# Patient Record
Sex: Male | Born: 1955 | Race: White | Marital: Married | Smoking: Former smoker
Health system: Southern US, Community
[De-identification: ages and names within clinical notes are randomized; demographics above are authoritative.]

## PROBLEM LIST (undated history)

## (undated) ENCOUNTER — Emergency Department (HOSPITAL_COMMUNITY): Admission: EM | Source: Home / Self Care

## (undated) DIAGNOSIS — N529 Male erectile dysfunction, unspecified: Secondary | ICD-10-CM

## (undated) DIAGNOSIS — I10 Essential (primary) hypertension: Secondary | ICD-10-CM

## (undated) DIAGNOSIS — E785 Hyperlipidemia, unspecified: Secondary | ICD-10-CM

## (undated) HISTORY — DX: Male erectile dysfunction, unspecified: N52.9

## (undated) HISTORY — DX: Essential (primary) hypertension: I10

## (undated) HISTORY — PX: SKIN SURGERY: SHX2413

## (undated) HISTORY — DX: Hyperlipidemia, unspecified: E78.5

---

## 2014-02-15 ENCOUNTER — Other Ambulatory Visit: Payer: Self-pay | Admitting: Family Medicine

## 2014-02-15 ENCOUNTER — Ambulatory Visit
Admission: RE | Admit: 2014-02-15 | Discharge: 2014-02-15 | Disposition: A | Discharge status: Home / Self Care | Payer: BC Managed Care – PPO | Source: Ambulatory Visit | Attending: Family Medicine | Admitting: Family Medicine

## 2014-02-15 DIAGNOSIS — R609 Edema, unspecified: Secondary | ICD-10-CM

## 2014-02-15 DIAGNOSIS — M25562 Pain in left knee: Secondary | ICD-10-CM

## 2014-03-17 ENCOUNTER — Ambulatory Visit (INDEPENDENT_AMBULATORY_CARE_PROVIDER_SITE_OTHER): Payer: Self-pay | Admitting: Surgery

## 2014-03-17 NOTE — H&P (Signed)
History of Present Illness Barry Barry(Barry Barry K. Mekhia Brogan MD; 03/17/2014 2:42 PM) Patient words: seb cyst.  The patient is a 58 year old male who presents with a complaint of Mass. Referred by Dr. Darrow Bussingibas Barry for evaluation of left chest mass. This is a 58 year old male in good health who presents with a 30 year history of a slowly enlarging mass on his left chest wall. This has become fairly large and causing some tenderness. This has never become infected. There has been no drainage. No imaging has been performed of this area. He presents now to discuss excision. Other Problems Barry Barry(Barry Barry, CMA; 03/17/2014 2:12 PM) High blood pressure  Past Surgical History Barry Barry(Barry Barry, CMA; 03/17/2014 2:12 PM) No pertinent past surgical history  Diagnostic Studies History Barry Barry(Barry Barry, CMA; 03/17/2014 2:12 PM) Colonoscopy 1-5 years ago  Allergies Barry Barry(Barry Barry, CMA; 03/17/2014 2:13 PM) No Known Drug Allergies 03/17/2014  Medication History Barry Barry(Barry Barry, CMA; 03/17/2014 2:14 PM) AmLODIPine Besylate (5MG  Tablet, Oral) Active.  Social History Barry Barry(Bar[place]; 03/17/2014 2:12 PM) Alcohol use Moderate alcohol use. Caffeine use Coffee. No drug use Tobacco use Former smoker.  Family History Barry Barry(Barry Barry, CMA; 03/17/2014 2:12 PM) Alcohol Abuse Father. Seizure disorder Sister.     Review of Systems Barry Barry(Barry Barry CMA; 03/17/2014 2:12 PM) General Not Present- Appetite Loss, Chills, Fatigue, Fever, Night Sweats, Weight Gain and Weight Loss. Skin Not Present- Change in Wart/Mole, Dryness, Hives, Jaundice, New Lesions, Non-Healing Wounds, Rash and Ulcer. HEENT Present- Hearing Loss. Not Present- Earache, Hoarseness, Nose Bleed, Oral Ulcers, Ringing in the Ears, Seasonal Allergies, Sinus Pain, Sore Throat, Visual Disturbances, Wears glasses/contact lenses and Yellow Eyes. Respiratory Present- Snoring. Not Present- Bloody sputum, Chronic Cough, Difficulty Breathing and  Wheezing. Breast Present- Breast Mass. Not Present- Breast Pain, Nipple Discharge and Skin Changes. Cardiovascular Not Present- Chest Pain, Difficulty Breathing Lying Down, Leg Cramps, Palpitations, Rapid Heart Rate, Shortness of Breath and Swelling of Extremities. Gastrointestinal Not Present- Abdominal Pain, Bloating, Bloody Stool, Change in Bowel Habits, Chronic diarrhea, Constipation, Difficulty Swallowing, Excessive gas, Gets full quickly at meals, Hemorrhoids, Indigestion, Nausea, Rectal Pain and Vomiting. Male Genitourinary Present- Impotence. Not Present- Blood in Urine, Change in Urinary Stream, Frequency, Nocturia, Painful Urination, Urgency and Urine Leakage. Musculoskeletal Not Present- Back Pain, Joint Pain, Joint Stiffness, Muscle Pain, Muscle Weakness and Swelling of Extremities. Neurological Not Present- Decreased Memory, Fainting, Headaches, Numbness, Seizures, Tingling, Tremor, Trouble walking and Weakness. Psychiatric Not Present- Anxiety, Bipolar, Change in Sleep Pattern, Depression, Fearful and Frequent crying. Endocrine Not Present- Cold Intolerance, Excessive Hunger, Hair Changes, Heat Intolerance, Hot flashes and New Diabetes. Hematology Not Present- Easy Bruising, Excessive bleeding, Gland problems, HIV and Persistent Infections.  Vitals Barry Barry(Barry Barry CMA; 03/17/2014 2:15 PM) 03/17/2014 2:13 PM Weight: 235.25 lb Height: 732in Body Surface Area: 7.42 m Body Mass Index: 0.31 kg/m Temp.: 98.22F  Pulse: 68 (Regular)  BP: 120/80 (Sitting, Left Arm, Standard)     Physical Exam Barry Barry(Barry Oguinn K. Simran Mannis MD; 03/17/2014 2:43 PM)  The physical exam findings are as follows: Note:WDWN in NAD HEENT: EOMI, sclera anicteric Neck: No masses, no thyromegaly Lungs: CTA bilaterally; normal respiratory effort Chest - left medial anterior chest - 4 cm soft subcutaneous mass - well-circumscribed; no inflammation or infection CV: Regular rate and rhythm; no murmurs Abd:  +bowel sounds, soft, non-tender, no masses Ext: Well-perfused; no edema Skin: Warm, dry; no sign of jaundice    Assessment & Plan Barry Barry(Barry Mcelreath K. 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 The surgical procedure has been discussed with the patient. Potential risks, benefits, alternative treatments, and expected outcomes have been explained. All of the patient's questions at this time have been answered. The likelihood of reaching the patient's treatment goal is good. The patient understand the proposed surgical procedure and wishes to proceed.   Barry ArmsMatthew K. Corliss Skainssuei, MD, [facility]  General/ Trauma Surgery  03/17/2014 2:44 PM

## 2014-11-08 ENCOUNTER — Ambulatory Visit
Admission: RE | Admit: 2014-11-08 | Discharge: 2014-11-08 | Disposition: A | Discharge status: Home / Self Care | Payer: BC Managed Care – PPO | Source: Ambulatory Visit | Attending: Family Medicine | Admitting: Family Medicine

## 2014-11-08 ENCOUNTER — Other Ambulatory Visit: Payer: Self-pay | Admitting: Family Medicine

## 2014-11-08 DIAGNOSIS — M7022 Olecranon bursitis, left elbow: Secondary | ICD-10-CM

## 2016-08-03 ENCOUNTER — Ambulatory Visit
Admission: RE | Admit: 2016-08-03 | Discharge: 2016-08-03 | Disposition: A | Discharge status: Home / Self Care | Payer: 59 | Source: Ambulatory Visit | Attending: Family Medicine | Admitting: Family Medicine

## 2016-08-03 ENCOUNTER — Other Ambulatory Visit: Payer: Self-pay | Admitting: Family Medicine

## 2016-08-03 DIAGNOSIS — M7650 Patellar tendinitis, unspecified knee: Secondary | ICD-10-CM

## 2016-08-03 DIAGNOSIS — M67969 Unspecified disorder of synovium and tendon, unspecified lower leg: Secondary | ICD-10-CM

## 2019-03-23 ENCOUNTER — Ambulatory Visit: Payer: 59 | Attending: Internal Medicine

## 2019-03-23 DIAGNOSIS — Z20822 Contact with and (suspected) exposure to covid-19: Secondary | ICD-10-CM

## 2019-03-24 LAB — NOVEL CORONAVIRUS, NAA: SARS-CoV-2, NAA: NOT DETECTED

## 2019-06-04 ENCOUNTER — Ambulatory Visit: Payer: 59 | Attending: Internal Medicine

## 2019-06-04 DIAGNOSIS — Z23 Encounter for immunization: Secondary | ICD-10-CM

## 2019-06-04 NOTE — Progress Notes (Signed)
   Covid-19 Vaccination Clinic  Name:  Barry Barry    MRN: [id number] DOB: [birth year]  06/04/2019  Mr. Barry Barry was observed post Covid-19 immunization for 15 minutes without incident. He was provided with Vaccine Information Sheet and instruction to access the V-Safe system.   Mr. Barry Barry was instructed to call 911 with any severe reactions post vaccine: Marland Kitchen Difficulty breathing  . Swelling of face and throat  . A fast heartbeat  . A bad rash all over body  . Dizziness and weakness   Immunizations Administered    Name Date Dose VIS Date Route   Pfizer COVID-19 Vaccine 06/04/2019 11:04 AM 0.3 mL 02/27/2019 Intramuscular   Manufacturer: [employer], [employer]   Lot: [id number]   NDC: [address]-7

## 2019-06-29 ENCOUNTER — Ambulatory Visit: Payer: 59

## 2019-07-01 ENCOUNTER — Ambulatory Visit: Payer: 59

## 2020-01-19 ENCOUNTER — Ambulatory Visit: Payer: 59 | Admitting: Cardiovascular Disease

## 2020-02-23 NOTE — Progress Notes (Signed)
Cardiology Office Note:   Date:  02/25/2020  NAME:  [name]    MRN: [id number] DOB:  Jun 19, 1955   PCP:  Darrow Bussing, MD  Cardiologist:  No primary care provider on file.   Referring MD: Darrow Bussing, MD   Chief Complaint  Patient presents with  . Hyperlipidemia   History of Present Illness:   Barry Barry is a 64 y.o. male with a hx of HLD who is being seen today for the evaluation of hyperlipidemia at the request of Koirala, Dibas, MD.  He reports he has had borderline cholesterol and his primary care physician will start a statin medication.  He was not keen on doing this.  He has concerns about muscle aches and inability to exercise.  He does have a history of hypertension.  He is a former smoker of about 10 years but quit nearly 30 years ago.  He has never had a heart attack or stroke.  He does exercising which includes weight lifting and some endurance training such as push-ups and sit ups.  He can complete 3 to 4 days/week up to 1 hour session without any limitations such as chest pain, shortness of breath or palpitations.  His EKG in office demonstrates normal sinus rhythm with no acute ischemic changes or evidence of prior infarction.  Blood pressure is 130/77.  BMI is 30.  He reports has not been exercising that much but hopes to get back into a routine regimen soon.  His 10-year ASCVD risk 4016% which is intermediate risk.  We did discuss that an intermediate range statin will be recommended.  He request to be further risk stratified with coronary calcium scoring.  No strong family history of heart disease.  He does drink alcohol in moderation.  No drug use reported.  He works as a IT trainer.  He is married with 1 adult son.  Problem list 1.  Hyperlipidemia -Total cholesterol 200, HDL 36, LDL 149, triglycerides 165 2.  Hypertension  Past Medical History: Past Medical History:  Diagnosis Date  . Erectile dysfunction   . Hyperlipidemia   . Hypertension     Past Surgical  History: Past Surgical History:  Procedure Laterality Date  . SKIN SURGERY      Current Medications: Current Meds  Medication Sig  . amLODipine (NORVASC) 5 MG tablet Take 5 mg by mouth daily.  Marland Kitchen anastrozole (ARIMIDEX) 1 MG tablet Take 1 mg by mouth daily.  . Loratadine 10 MG CAPS   . tadalafil (CIALIS) 5 MG tablet Take 5 mg by mouth 1 day or 1 dose.  . Testosterone Undecanoate (JATENZO) 237 MG CAPS Jatenzo 237 mg capsule  . [DISCONTINUED] AMLODIPINE BESYLATE PO Take 5 mg by mouth daily.     Allergies:    Patient has no allergy information on record.   Social History: Social History   Socioeconomic History  . Marital status: Married    Spouse name: Not on file  . Number of children: 1  . Years of education: Not on file  . Highest education level: Not on file  Occupational History  . Occupation: IT trainer  Tobacco Use  . Smoking status: Former Smoker    Years: 10.00  . Smokeless tobacco: Never Used  Substance and Sexual Activity  . Alcohol use: Not Currently    Alcohol/week: 2.0 standard drinks    Types: 2 Cans of beer per week  . Drug use: Not Currently  . Sexual activity: Yes    Partners: Female  Other Topics  Concern  . Not on file  Social History Narrative  . Not on file   Social Determinants of Health   Financial Resource Strain: Not on file  Food Insecurity: Not on file  Transportation Needs: Not on file  Physical Activity: Not on file  Stress: Not on file  Social Connections: Not on file     Family History: The patient's family history includes Heart attack in his paternal grandfather.  ROS:   All other ROS reviewed and negative. Pertinent positives noted in the HPI.     EKGs/Labs/Other Studies Reviewed:   The following studies were personally reviewed by me today:  EKG:  EKG is ordered today.  The ekg ordered today demonstrates normal sinus rhythm, heart rate 61, first-degree AV block noted, and was personally reviewed by me.   Recent Labs: No  results found for requested labs within last 8760 hours.   Recent Lipid Panel No results found for: CHOL, TRIG, HDL, CHOLHDL, VLDL, LDLCALC, LDLDIRECT  Physical Exam:   VS:  BP 130/77   Pulse 61   Ht 6\' 1"  (1.854 m)   Wt 231 lb 6.4 oz (105 kg)   SpO2 95%   BMI 30.53 kg/m    Wt Readings from Last 3 Encounters:  02/25/20 231 lb 6.4 oz (105 kg)    General: Well nourished, well developed, in no acute distress Heart: Atraumatic, normal size  Eyes: PEERLA, EOMI  Neck: Supple, no JVD Endocrine: No thryomegaly Cardiac: Normal S1, S2; RRR; no murmurs, rubs, or gallops Lungs: Clear to auscultation bilaterally, no wheezing, rhonchi or rales  Abd: Soft, nontender, no hepatomegaly  Ext: No edema, pulses 2+ Musculoskeletal: No deformities, BUE and BLE strength normal and equal Skin: Warm and dry, no rashes   Neuro: Alert and oriented to person, place, time, and situation, CNII-XII grossly intact, no focal deficits  Psych: Normal mood and affect   ASSESSMENT:   Barry Barry is a 64 y.o. male who presents for the following: 1. Mixed hyperlipidemia     PLAN:   1. Mixed hyperlipidemia -Most recent LDL 149.  10-year ASCVD risk for 16% which is intermediate risk.  We will proceed with further risk ratification with coronary calcium scoring.  I will plan to see him yearly but will notify him of his results by phone.  Should he need reevaluation we will do this after his CT scan.  Disposition: Return in about 1 year (around 02/24/2021).  Medication Adjustments/Labs and Tests Ordered: Current medicines are reviewed at length with the patient today.  Concerns regarding medicines are outlined above.  Orders Placed This Encounter  Procedures  . CT CARDIAC SCORING  . EKG 12-Lead   No orders of the defined types were placed in this encounter.   Patient Instructions  Medication Instructions:  No Changes In Medications at this time.  *If you need a refill on your cardiac medications before  your next appointment, please call your pharmacy*  Testing/Procedures: Dr. 14/11/2020  has ordered a CT coronary calcium score. This test is done at [address]. This is $150 out of pocket.  Coronary CalciumScan A coronary calcium scan is an imaging test used to look for deposits of calcium and other fatty materials (plaques) in the inner lining of the blood vessels of the heart (coronary arteries). These deposits of calcium and plaques can partly clog and narrow the coronary arteries without producing any symptoms or warning signs. This puts a person at risk for a heart  attack. This test can detect these deposits before symptoms develop. Tell a health care provider about:  Any allergies you have.  All medicines you are taking, including vitamins, herbs, eye drops, creams, and over-the-counter medicines.  Any problems you or family members have had with anesthetic medicines.  Any blood disorders you have.  Any surgeries you have had.  Any medical conditions you have.  Whether you are pregnant or may be pregnant. What are the risks? Generally, this is a safe procedure. However, problems may occur, including:  Harm to a pregnant woman and her unborn baby. This test involves the use of radiation. Radiation exposure can be dangerous to a pregnant woman and her unborn baby. If you are pregnant, you generally should not have this procedure done.  Slight increase in the risk of cancer. This is because of the radiation involved in the test. What happens before the procedure? No preparation is needed for this procedure. What happens during the procedure?  You will undress and remove any jewelry around your neck or chest.  You will put on a hospital gown.  Sticky electrodes will be placed on your chest. The electrodes will be connected to an electrocardiogram (ECG) machine to record a tracing of the electrical activity of your heart.  A CT scanner will take pictures of  your heart. During this time, you will be asked to lie still and hold your breath for 2-3 seconds while a picture of your heart is being taken. The procedure may vary among health care providers and hospitals. What happens after the procedure?  You can get dressed.  You can return to your normal activities.  It is up to you to get the results of your test. Ask your health care provider, or the department that is doing the test, when your results will be ready. Summary  A coronary calcium scan is an imaging test used to look for deposits of calcium and other fatty materials (plaques) in the inner lining of the blood vessels of the heart (coronary arteries).  Generally, this is a safe procedure. Tell your health care provider if you are pregnant or may be pregnant.  No preparation is needed for this procedure.  A CT scanner will take pictures of your heart.  You can return to your normal activities after the scan is done. This information is not intended to replace advice given to you by your health care provider. Make sure you discuss any questions you have with your health care provider. Document Released: 09/01/2007 Document Revised: 01/23/2016 Document Reviewed: 01/23/2016 Elsevier Interactive Patient Education  2017 [employer].  Follow-Up: At [facility], you and your health needs are our priority.  As part of our continuing mission to provide you with exceptional heart care, we have created designated Provider Care Teams.  These Care Teams include your primary Cardiologist (physician) and Advanced Practice Providers (APPs -  Physician Assistants and Nurse Practitioners) who all work together to provide you with the care you need, when you need it.  Your next appointment:   1 year(s)  The format for your next appointment:   In Person Or Virtual   Provider:   Lennie Odor, MD                       Signed, Lenna Gilford. [address], MD [facility]  [address], [address] [place], [place] [address] [phone]  02/25/2020 9:15 AM

## 2020-02-25 ENCOUNTER — Encounter: Payer: Self-pay | Admitting: Cardiovascular Disease

## 2020-02-25 ENCOUNTER — Ambulatory Visit (INDEPENDENT_AMBULATORY_CARE_PROVIDER_SITE_OTHER): Payer: 59 | Admitting: Cardiovascular Disease

## 2020-02-25 ENCOUNTER — Other Ambulatory Visit: Payer: Self-pay

## 2020-02-25 VITALS — BP 130/77 | HR 61 | Ht 73.0 in | Wt 231.4 lb

## 2020-02-25 DIAGNOSIS — E782 Mixed hyperlipidemia: Secondary | ICD-10-CM | POA: Diagnosis not present

## 2020-02-25 NOTE — Patient Instructions (Signed)
Medication Instructions:  No Changes In Medications at this time.  *If you need a refill on your cardiac medications before your next appointment, please call your pharmacy*  Testing/Procedures: Dr. [address]  has ordered a CT coronary calcium score. This test is done at [address]. This is $150 out of pocket.  Coronary CalciumScan A coronary calcium scan is an imaging test used to look for deposits of calcium and other fatty materials (plaques) in the inner lining of the blood vessels of the heart (coronary arteries). These deposits of calcium and plaques can partly clog and narrow the coronary arteries without producing any symptoms or warning signs. This puts a person at risk for a heart attack. This test can detect these deposits before symptoms develop. Tell a health care provider about:  Any allergies you have.  All medicines you are taking, including vitamins, herbs, eye drops, creams, and over-the-counter medicines.  Any problems you or family members have had with anesthetic medicines.  Any blood disorders you have.  Any surgeries you have had.  Any medical conditions you have.  Whether you are pregnant or may be pregnant. What are the risks? Generally, this is a safe procedure. However, problems may occur, including:  Harm to a pregnant woman and her unborn baby. This test involves the use of radiation. Radiation exposure can be dangerous to a pregnant woman and her unborn baby. If you are pregnant, you generally should not have this procedure done.  Slight increase in the risk of cancer. This is because of the radiation involved in the test. What happens before the procedure? No preparation is needed for this procedure. What happens during the procedure?  You will undress and remove any jewelry around your neck or chest.  You will put on a hospital gown.  Sticky electrodes will be placed on your chest. The electrodes will be connected to an  electrocardiogram (ECG) machine to record a tracing of the electrical activity of your heart.  A CT scanner will take pictures of your heart. During this time, you will be asked to lie still and hold your breath for 2-3 seconds while a picture of your heart is being taken. The procedure may vary among health care providers and hospitals. What happens after the procedure?  You can get dressed.  You can return to your normal activities.  It is up to you to get the results of your test. Ask your health care provider, or the department that is doing the test, when your results will be ready. Summary  A coronary calcium scan is an imaging test used to look for deposits of calcium and other fatty materials (plaques) in the inner lining of the blood vessels of the heart (coronary arteries).  Generally, this is a safe procedure. Tell your health care provider if you are pregnant or may be pregnant.  No preparation is needed for this procedure.  A CT scanner will take pictures of your heart.  You can return to your normal activities after the scan is done. This information is not intended to replace advice given to you by your health care provider. Make sure you discuss any questions you have with your health care provider. Document Released: 09/01/2007 Document Revised: 01/23/2016 Document Reviewed: 01/23/2016 Elsevier Interactive Patient Education  2017 [employer].  Follow-Up: At [facility], you and your health needs are our priority.  As part of our continuing mission to provide you with exceptional heart care, we have created  designated Provider Care Teams.  These Care Teams include your primary Cardiologist (physician) and Advanced Practice Providers (APPs -  Physician Assistants and Nurse Practitioners) who all work together to provide you with the care you need, when you need it.  Your next appointment:   1 year(s)  The format for your next appointment:   In Person Or Virtual    Provider:   Lennie Odor, MD

## 2020-03-10 ENCOUNTER — Other Ambulatory Visit: Payer: Self-pay

## 2020-03-10 ENCOUNTER — Ambulatory Visit (INDEPENDENT_AMBULATORY_CARE_PROVIDER_SITE_OTHER)
Admission: RE | Admit: 2020-03-10 | Discharge: 2020-03-10 | Disposition: A | Discharge status: Home / Self Care | Payer: Self-pay | Source: Ambulatory Visit | Attending: Cardiovascular Disease | Admitting: Cardiovascular Disease

## 2020-03-10 DIAGNOSIS — E782 Mixed hyperlipidemia: Secondary | ICD-10-CM

## 2021-05-24 ENCOUNTER — Other Ambulatory Visit: Payer: Self-pay | Admitting: Family Medicine

## 2021-05-24 ENCOUNTER — Ambulatory Visit
Admission: RE | Admit: 2021-05-24 | Discharge: 2021-05-24 | Disposition: A | Discharge status: Home / Self Care | Payer: 59 | Source: Ambulatory Visit | Attending: Family Medicine | Admitting: Family Medicine

## 2021-05-24 DIAGNOSIS — W19XXXA Unspecified fall, initial encounter: Secondary | ICD-10-CM

## 2021-06-05 ENCOUNTER — Emergency Department (HOSPITAL_COMMUNITY): Payer: 59

## 2021-06-05 ENCOUNTER — Emergency Department (HOSPITAL_COMMUNITY)
Admission: EM | Admit: 2021-06-05 | Discharge: 2021-06-06 | Disposition: A | Discharge status: Home / Self Care | Payer: 59 | Attending: Emergency Medicine | Admitting: Emergency Medicine

## 2021-06-05 ENCOUNTER — Encounter (HOSPITAL_COMMUNITY): Payer: Self-pay | Admitting: Emergency Medicine

## 2021-06-05 DIAGNOSIS — R0602 Shortness of breath: Secondary | ICD-10-CM | POA: Insufficient documentation

## 2021-06-05 DIAGNOSIS — Z79899 Other long term (current) drug therapy: Secondary | ICD-10-CM | POA: Diagnosis not present

## 2021-06-05 DIAGNOSIS — R0789 Other chest pain: Secondary | ICD-10-CM | POA: Diagnosis not present

## 2021-06-05 DIAGNOSIS — I10 Essential (primary) hypertension: Secondary | ICD-10-CM | POA: Diagnosis not present

## 2021-06-05 DIAGNOSIS — R079 Chest pain, unspecified: Secondary | ICD-10-CM | POA: Diagnosis present

## 2021-06-05 LAB — CBC WITH DIFFERENTIAL/PLATELET
Abs Immature Granulocytes: 0.01 10*3/uL (ref 0.00–0.07)
Basophils Absolute: 0 10*3/uL (ref 0.0–0.1)
Basophils Relative: 1 %
Eosinophils Absolute: 0.3 10*3/uL (ref 0.0–0.5)
Eosinophils Relative: 4 %
HCT: 49 % (ref 39.0–52.0)
Hemoglobin: 15.6 g/dL (ref 13.0–17.0)
Immature Granulocytes: 0 %
Lymphocytes Relative: 28 %
Lymphs Abs: 1.8 10*3/uL (ref 0.7–4.0)
MCH: 29 pg (ref 26.0–34.0)
MCHC: 31.8 g/dL (ref 30.0–36.0)
MCV: 91.1 fL (ref 80.0–100.0)
Monocytes Absolute: 0.4 10*3/uL (ref 0.1–1.0)
Monocytes Relative: 7 %
Neutro Abs: 3.8 10*3/uL (ref 1.7–7.7)
Neutrophils Relative %: 60 %
Platelets: 218 10*3/uL (ref 150–400)
RBC: 5.38 MIL/uL (ref 4.22–5.81)
RDW: 13.4 % (ref 11.5–15.5)
WBC: 6.3 10*3/uL (ref 4.0–10.5)
nRBC: 0 % (ref 0.0–0.2)

## 2021-06-05 LAB — COMPREHENSIVE METABOLIC PANEL
ALT: 33 U/L (ref 0–44)
AST: 21 U/L (ref 15–41)
Albumin: 3.8 g/dL (ref 3.5–5.0)
Alkaline Phosphatase: 47 U/L (ref 38–126)
Anion gap: 7 (ref 5–15)
BUN: 13 mg/dL (ref 8–23)
CO2: 28 mmol/L (ref 22–32)
Calcium: 9.1 mg/dL (ref 8.9–10.3)
Chloride: 105 mmol/L (ref 98–111)
Creatinine, Ser: 1.17 mg/dL (ref 0.61–1.24)
GFR, Estimated: >60 mL/min (ref >60)
Glucose, Bld: 75 mg/dL (ref 70–99)
Potassium: 3.9 mmol/L (ref 3.5–5.1)
Sodium: 140 mmol/L (ref 135–145)
Total Bilirubin: 0.8 mg/dL (ref 0.3–1.2)
Total Protein: 6.4 g/dL — ABNORMAL LOW (ref 6.5–8.1)

## 2021-06-05 NOTE — ED Notes (Signed)
Called twice for vitals and no response ?

## 2021-06-05 NOTE — ED Triage Notes (Signed)
Patient complains of right sided rib pain that started after a fall twelve days ago, had x-ray then and no fractures. Pain has continued and is getting worse, now coughing which makes pain worse. ?

## 2021-06-05 NOTE — ED Provider Triage Note (Signed)
Emergency Medicine Provider Triage Evaluation Note ? ?Barry Barry , Barry 66 y.o. male  was evaluated in triage.  Pt complains of right rib pain onset 05/24/2021.  Patient notes that he fell and landed on his right ribs on 05/24/2021.  He was evaluated his primary care provider office with negative chest x-ray and right rib x-ray at the time.  Patient was not given an incentive spirometer.  Has tried naproxen with no relief of his symptoms.  He notes that he had some wheezing.  Patient was evaluated by his primary care provider today with negative imaging.  Denies chest pain, shortness of breath. ? ?Review of Systems  ?Positive: As per HPI above ?Negative:  ? ?Physical Exam  ?BP 134/74 (BP Location: Right Arm)   Pulse 60   Temp 97.6 ?F (36.4 ?C) (Oral)   Resp 16   SpO2 92%  ?Gen:   Awake, no distress   ?Resp:  Normal effort  ?MSK:   Moves extremities without difficulty  ?Other:  No chest wall tenderness to palpation.  No spinal or paraspinal tenderness to palpation.  Tenderness to palpation noted to right lateral ribs with small area of deformity.  No overlying skin changes. ? ?Medical Decision Making  ?Medically screening exam initiated at 4:56 PM.  Appropriate orders placed.  Barry Barry was informed that the remainder of the evaluation will be completed by another provider, this initial triage assessment does not replace that evaluation, and the importance of remaining in the ED until their evaluation is complete. ?  ?Barry Gigante A, PA-C ?06/05/21 1657 ? ?

## 2021-06-06 ENCOUNTER — Emergency Department (HOSPITAL_COMMUNITY): Payer: 59

## 2021-06-06 LAB — D-DIMER, QUANTITATIVE: D-Dimer, Quant: 1.87 ug/mL-FEU — ABNORMAL HIGH (ref 0.00–0.50)

## 2021-06-06 LAB — TROPONIN I (HIGH SENSITIVITY): Troponin I (High Sensitivity): 4 ng/L (ref <18)

## 2021-06-06 MED ORDER — LIDOCAINE 5 % EX PTCH
1.0000 | MEDICATED_PATCH | CUTANEOUS | Status: DC
  Filled 2021-06-06: qty 1

## 2021-06-06 MED ORDER — IOHEXOL 350 MG/ML SOLN
66.0000 mL | Freq: Once | INTRAVENOUS | Status: AC | PRN
  Administered 2021-06-06: 66 mL via INTRAVENOUS

## 2021-06-06 MED ORDER — ALBUTEROL SULFATE HFA 108 (90 BASE) MCG/ACT IN AERS
2.0000 | INHALATION_SPRAY | Freq: Once | RESPIRATORY_TRACT | Status: AC
  Administered 2021-06-06: 2 via RESPIRATORY_TRACT
  Filled 2021-06-06: qty 6.7

## 2021-06-06 NOTE — ED Notes (Signed)
Pt provided instructions on IS and inhaler ?

## 2021-06-06 NOTE — ED Provider Notes (Signed)
?[facility] ?Provider Note ? ? ?CSN: [id number] ?Arrival date & time: 06/05/21  1538 ? ?  ? ?History ? ?Chief Complaint  ?Patient presents with  ? Ribcage Pain  ? ? ?Barry Barry is a 66 y.o. male with a history of hypertension and hyperlipidemia who presents to the emergency department with complaints of chest pain.  Patient reports that 05/25/2021 he had a fall during which he fell onto the right side of his rib cage.  He was initially having pain to his right side, was taking naproxen without much change, saw his PCP and had x-rays that did not show any fractures, however pain started to move more to the center of the chest.  Pain only occurs with coughing, sneezing, and deep breathing.  Today he felt he had some wheezing with some shortness of breath.  Given continued pain with the symptoms he went to urgent care and was ultimately referred to the emergency department.  He denies current wheezing/dyspnea.  He denies fever, chills, hemoptysis, abdominal pain, leg pain/swelling, prior VTE/cancer, recent surgery/hospitalization, or syncope. ? ?HPI ? ?  ? ?Home Medications ?Prior to Admission medications   ?Medication Sig Start Date End Date Taking? Authorizing Provider  ?amLODipine (NORVASC) 5 MG tablet Take 5 mg by mouth daily. 11/27/19  Yes [provider]  ?anastrozole (ARIMIDEX) 1 MG tablet Take 1 mg by mouth daily. 12/12/19  Yes [provider]  ?Loratadine 10 MG CAPS Take 10 mg by mouth daily. 02/16/18  Yes [provider]  ?naproxen sodium (ALEVE) 220 MG tablet Take 440 mg by mouth 2 (two) times daily as needed (pain).   Yes [provider]  ?tadalafil (CIALIS) 5 MG tablet Take 5 mg by mouth daily.   Yes [provider]  ?testosterone cypionate (DEPOTESTOSTERONE CYPIONATE) 200 MG/ML injection Inject 200 mg into the muscle every Sunday. 05/09/21  Yes [provider]  ?   ? ?Allergies    ?Patient has no known allergies.    ? ?Review of Systems   ?Review of Systems  ?Constitutional:  Negative for chills and fever.  ?HENT:  Positive for congestion.   ?Respiratory:  Positive for cough, shortness of breath and wheezing.   ?Cardiovascular:  Positive for chest pain. Negative for leg swelling.  ?Gastrointestinal:  Negative for abdominal pain, nausea and vomiting.  ?Neurological:  Negative for syncope.  ?All other systems reviewed and are negative. ? ?Physical Exam ?Updated Vital Signs ?BP (!) 142/76   Pulse (!) 58   Temp 97.7 ?F (36.5 ?C)   Resp 14   SpO2 97%  ?Physical Exam ?Vitals and nursing note reviewed.  ?Constitutional:   ?   General: He is not in acute distress. ?   Appearance: He is well-developed. He is not toxic-appearing.  ?HENT:  ?   Head: Normocephalic and atraumatic.  ?   Right Ear: Ear canal normal. Tympanic membrane is not perforated, erythematous, retracted or bulging.  ?   Left Ear: Ear canal normal. Tympanic membrane is not perforated, erythematous, retracted or bulging.  ?   Ears:  ?   Comments: No mastoid erythema/swellng/tenderness.  ?   Nose:  ?   Right Sinus: No maxillary sinus tenderness or frontal sinus tenderness.  ?   Left Sinus: No maxillary sinus tenderness or frontal sinus tenderness.  ?   Mouth/Throat:  ?   Pharynx: Oropharynx is clear. Uvula midline. No oropharyngeal exudate or posterior oropharyngeal erythema.  ?   Comments: Posterior oropharynx  is symmetric appearing. Patient tolerating own secretions without difficulty. No trismus. No drooling. No hot potato voice. No swelling beneath the tongue, submandibular compartment is soft.  ?Eyes:  ?   General:     ?   Right eye: No discharge.     ?   Left eye: No discharge.  ?   Conjunctiva/sclera: Conjunctivae normal.  ?Cardiovascular:  ?   Rate and Rhythm: Normal rate and regular rhythm.  ?Pulmonary:  ?   Effort: Pulmonary effort is normal. No respiratory distress.  ?   Breath sounds: Normal breath sounds. No wheezing, rhonchi or rales.  ?Chest:  ?   Chest  wall: Tenderness (mild central anterior chest wall without overlying skin changes) present.  ?Abdominal:  ?   General: There is no distension.  ?   Palpations: Abdomen is soft.  ?   Tenderness: There is no abdominal tenderness.  ?Musculoskeletal:  ?   Cervical back: Neck supple. No rigidity.  ?Lymphadenopathy:  ?   Cervical: No cervical adenopathy.  ?Skin: ?   General: Skin is warm and dry.  ?   Findings: No rash.  ?Neurological:  ?   Mental Status: He is alert.  ?Psychiatric:     ?   Behavior: Behavior normal.  ? ? ?ED Results / Procedures / Treatments   ?Labs ?(all labs ordered are listed, but only abnormal results are displayed) ?Labs Reviewed  ?COMPREHENSIVE METABOLIC PANEL - Abnormal; Notable for the following components:  ?    Result Value  ? Total Protein 6.4 (*)   ? All other components within normal limits  ?D-DIMER, QUANTITATIVE - Abnormal; Notable for the following components:  ? D-Dimer, Quant 1.87 (*)   ? All other components within normal limits  ?CBC WITH DIFFERENTIAL/PLATELET  ?TROPONIN I (HIGH SENSITIVITY)  ? ? ?EKG ?EKG Interpretation ? ?Date/Time:  Tuesday June 06 2021 04:35:49 EDT ?Ventricular Rate:  63 ?PR Interval:  239 ?QRS Duration: 106 ?QT Interval:  402 ?QTC Calculation: 412 ?R Axis:   -48 ?Text Interpretation: Sinus rhythm Prolonged PR interval LAD, consider left anterior fascicular block Anterior infarct, old No previous ECGs available Confirmed by Ripley Fraise [phone]) on 06/06/2021 5:04:10 AM ? ?Radiology ?DG Chest 2 View ? ?Result Date: 06/05/2021 ?CLINICAL DATA:  Chest pain EXAM: CHEST - 2 VIEW COMPARISON:  05/24/2021 FINDINGS: Transverse diameter of heart is slightly increased. There are no signs of pulmonary edema or focal pulmonary consolidation. There is no pleural effusion or pneumothorax. IMPRESSION: No active cardiopulmonary disease. Electronically Signed   By: Elmer Picker M.D.   On: 06/05/2021 17:15  ? ?DG Ribs Unilateral W/Chest Right ? ?Result Date:  06/05/2021 ?CLINICAL DATA:  Pain, previous fall EXAM: RIGHT RIBS AND CHEST - 3+ VIEW COMPARISON:  05/24/2021 FINDINGS: No displaced fractures are seen in the right ribs. There is no focal pulmonary contusion. There is no pleural effusion or pneumothorax. IMPRESSION: No fracture is seen in the right ribs. Electronically Signed   By: Elmer Picker M.D.   On: 06/05/2021 17:18  ? ?CT Angio Chest PE W/Cm &/Or Wo Cm ? ?Result Date: 06/06/2021 ?CLINICAL DATA:  66 year old male with history of positive D-dimer. Suspected pulmonary embolism. EXAM: CT ANGIOGRAPHY CHEST WITH CONTRAST TECHNIQUE: Multidetector CT imaging of the chest was performed using the standard protocol during bolus administration of intravenous contrast. Multiplanar CT image reconstructions and MIPs were obtained to evaluate the vascular anatomy. RADIATION DOSE REDUCTION: This exam was performed according to the departmental dose-optimization program which includes automated exposure  control, adjustment of the mA and/or kV according to patient size and/or use of iterative reconstruction technique. CONTRAST:  57mL OMNIPAQUE IOHEXOL 350 MG/ML SOLN COMPARISON:  Cardiac CT 03/10/2020. FINDINGS: Cardiovascular: There are no filling defects within the pulmonary arterial tree to suggest the presence of pulmonary embolism. Heart size is borderline enlarged. There is no significant pericardial fluid, thickening or pericardial calcification. Aortic atherosclerosis. No definite coronary artery calcifications. Mediastinum/Nodes: No pathologically enlarged mediastinal or hilar lymph nodes. Hilar esophagus is unremarkable in appearance. No axillary lymphadenopathy. Lungs/Pleura: No acute consolidative airspace disease. No pleural effusions. No suspicious appearing pulmonary nodules or masses are noted. Areas of dependent subsegmental atelectasis or mild scarring are noted in the lower lobes of the lungs bilaterally. Upper Abdomen: Unremarkable. Musculoskeletal:  There are no aggressive appearing lytic or blastic lesions noted in the visualized portions of the skeleton. Review of the MIP images confirms the above findings. IMPRESSION: 1. No acute findings to account for the patient'

## 2021-06-06 NOTE — Discharge Instructions (Signed)
You were seen in the emergency department today for chest pain and trouble breathing with some wheezing earlier.  Your work-up in the ER was overall reassuring.  Your blood work did not show significant abnormalities, your D-dimer blood test was mildly elevated therefore we did a CT scan.  The CT scan did not show findings of blood clot, collapsed lung, or broken bones.  Your CT scan did show some mild plaque in your arteries which can occur over time, please discuss with your primary care provider to ensure that your cholesterol and blood pressure are well controlled. ? ? ?We are sending you with an inhaler to use 1 to 2 puffs every 4-6 hours as needed for wheezing/shortness of breath. ? ?We are also sending you home with an incentive spirometer to use once every 2 hours while awake to help with lung aeration. ? ? ?Please follow-up with primary care within 3 days for reevaluation.  Return to the emergency department for new or worsening symptoms including but not limited to new or worsening pain, increased work of breathing, fever, coughing up blood, coughing up thick sputum, passing out, or any other concerns. ?

## 2022-10-14 IMAGING — CR DG CHEST 2V
3 series · 3 of 3 positions shown · non-contrast
Comparison: 05/24/2021

CLINICAL DATA: Chest pain

EXAM:
CHEST - 2 VIEW

[chest pa (1 of 2)]
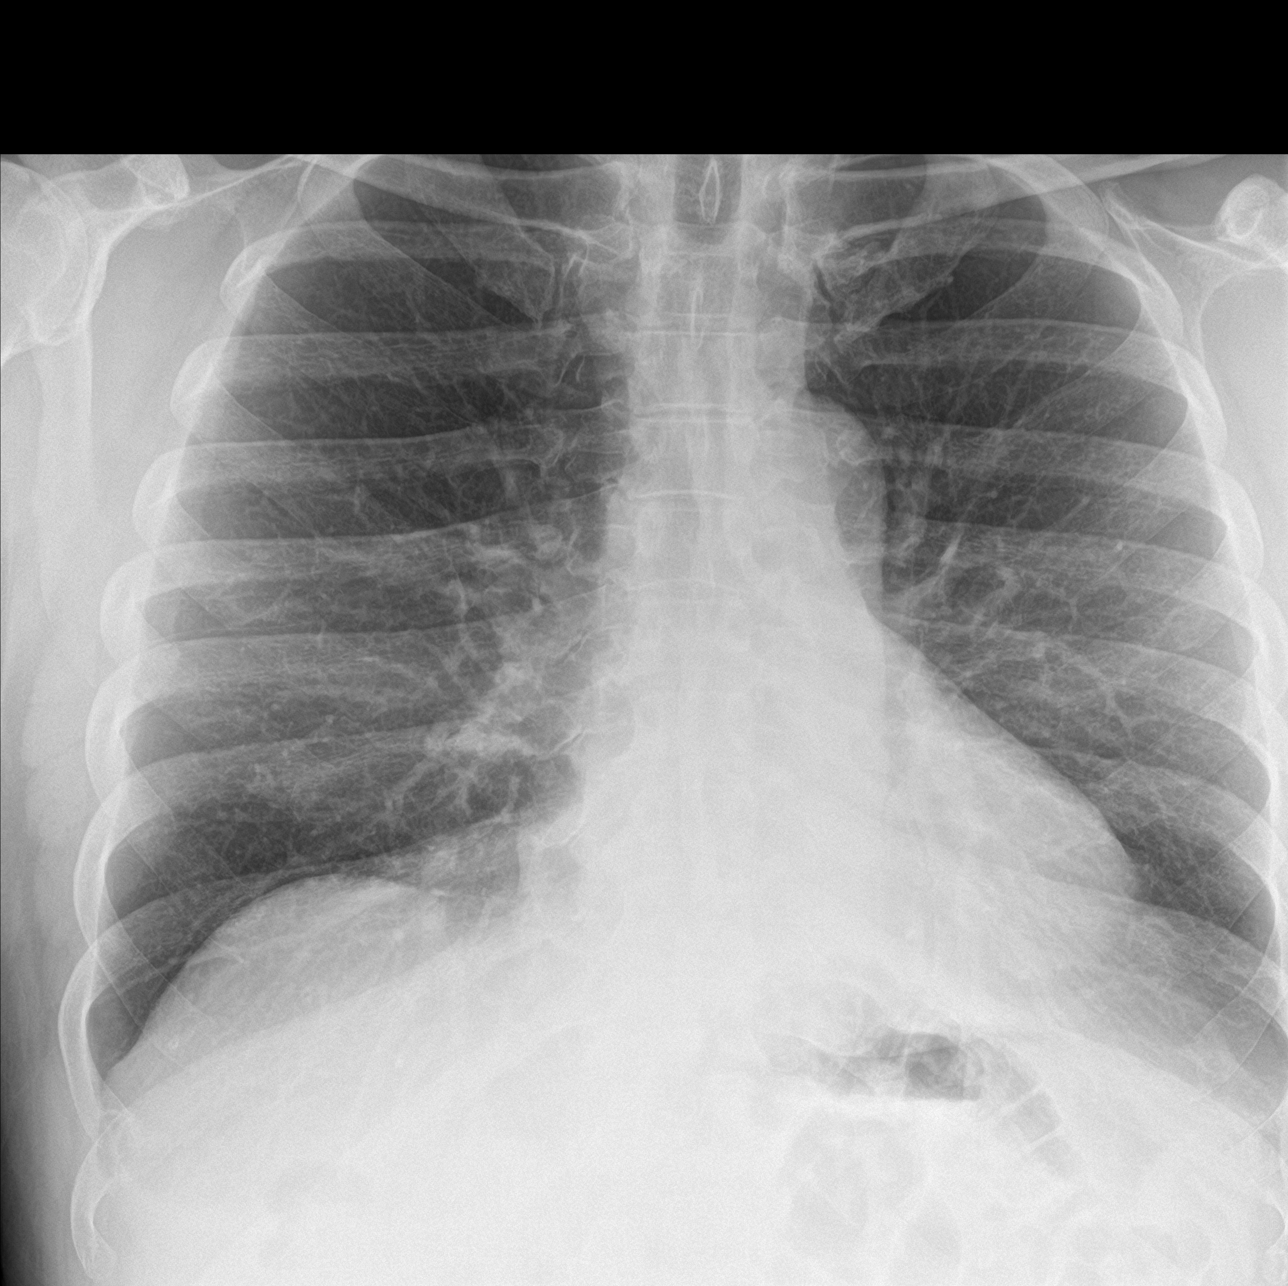

[chest lat]
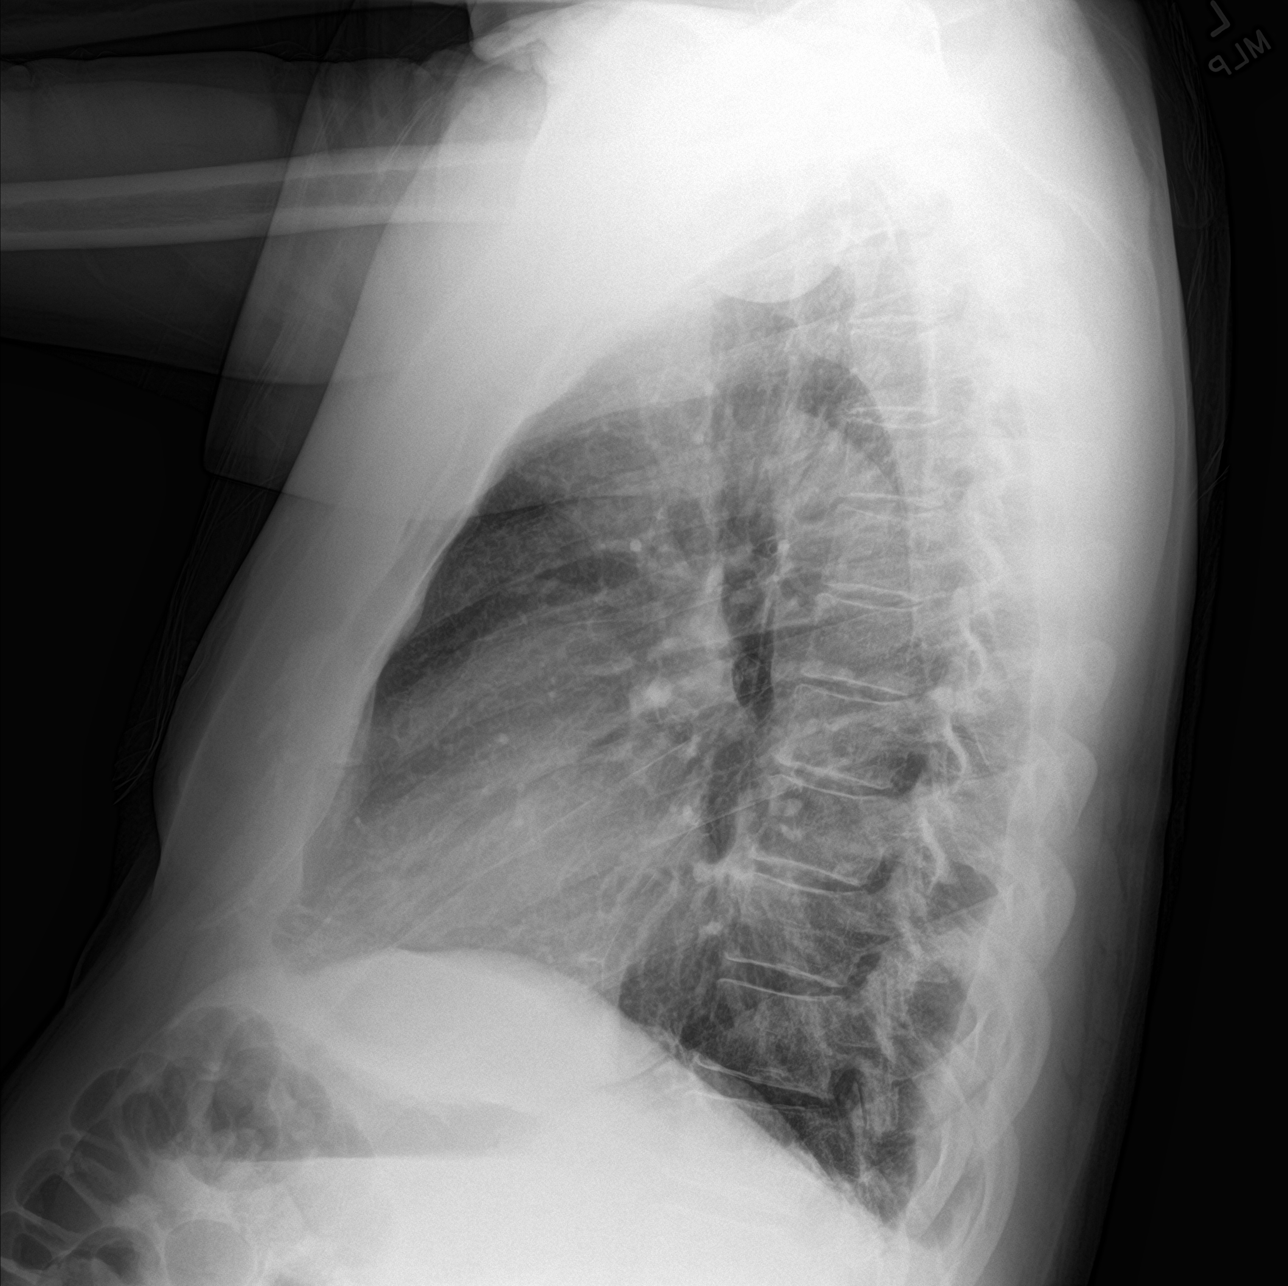

[chest pa (2 of 2)]
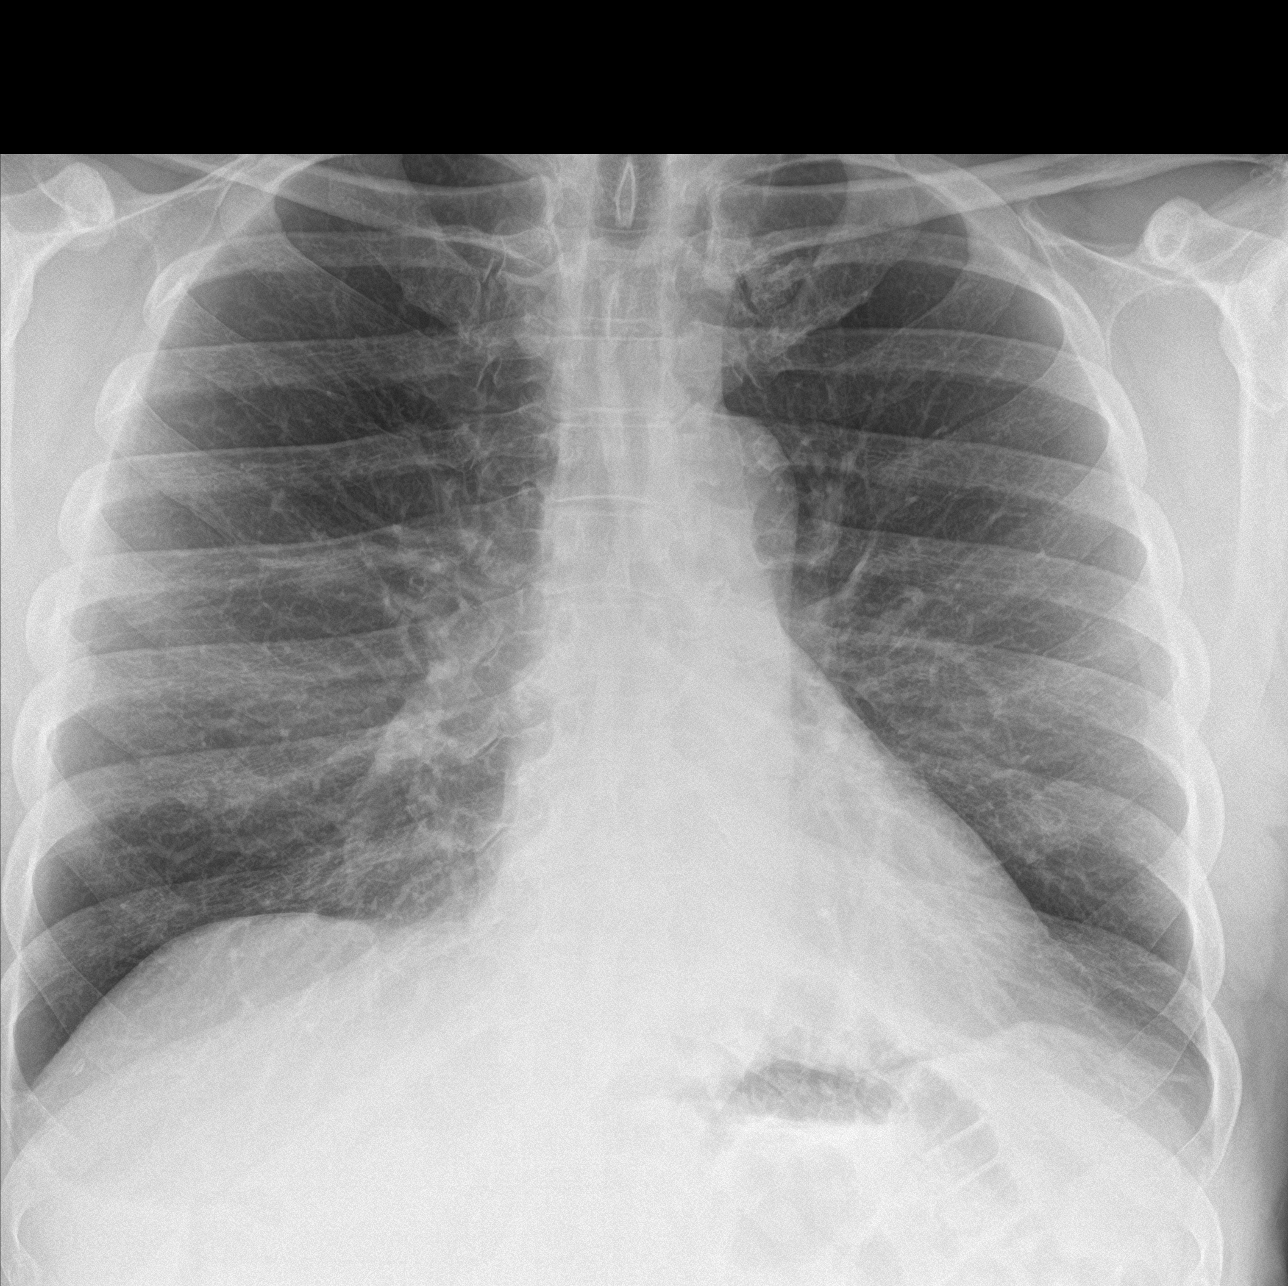

[3 of 3 positions shown; findings below may reference images not displayed]

FINDINGS: Transverse diameter of heart is slightly increased. There are no
signs of pulmonary edema or focal pulmonary consolidation. There is
no pleural effusion or pneumothorax.
IMPRESSION: No active cardiopulmonary disease.

## 2022-10-15 IMAGING — CT CT ANGIO CHEST
2 of 6 series · 18 of 46 positions shown · IV contrast (agent unspecified)
Comparison: Cardiac CT 03/10/2020.

CLINICAL DATA: 65-year-old male with history of positive D-dimer.
Suspected pulmonary embolism.

EXAM:
CT ANGIOGRAPHY CHEST WITH CONTRAST
TECHNIQUE: Multidetector CT imaging of the chest was performed using the
standard protocol during bolus administration of intravenous
contrast. Multiplanar CT image reconstructions and MIPs were
obtained to evaluate the vascular anatomy.

[Series 7: thins · axial · 0.93mm/px · z∈[+1118,+1390]mm · 15 of 298 slices shown]
[im 13/298  lung]
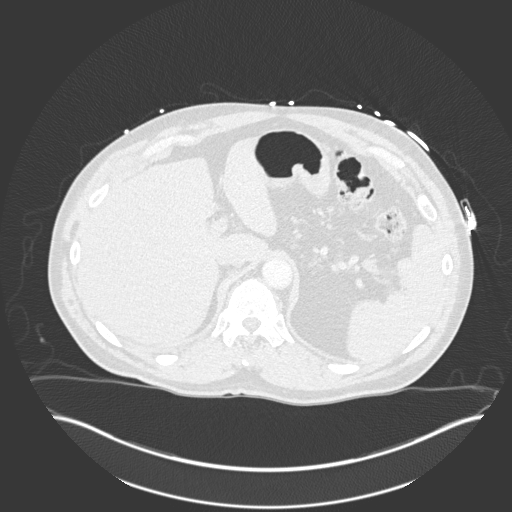
[im 39/298  soft-tissue]
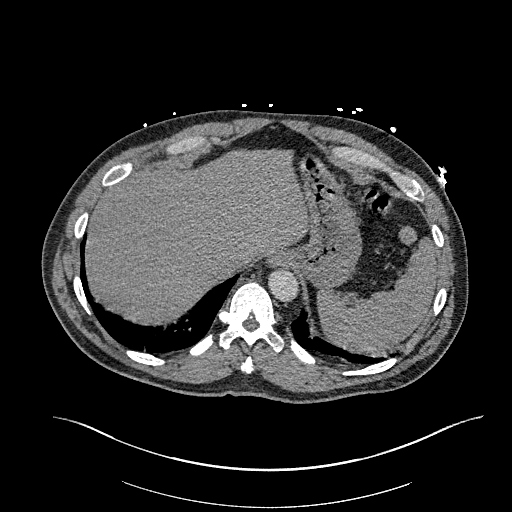
[im 52/298  lung]
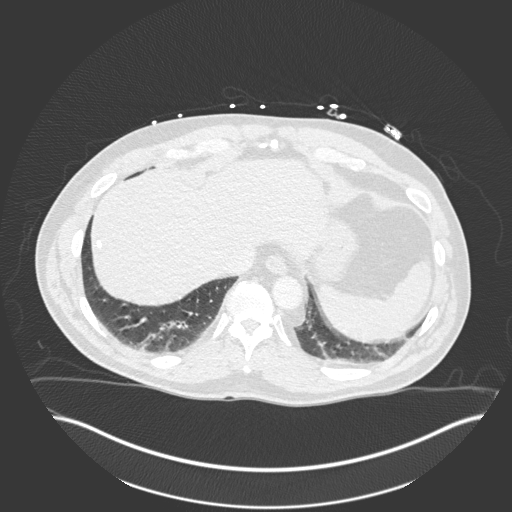
[im 78/298  soft-tissue]
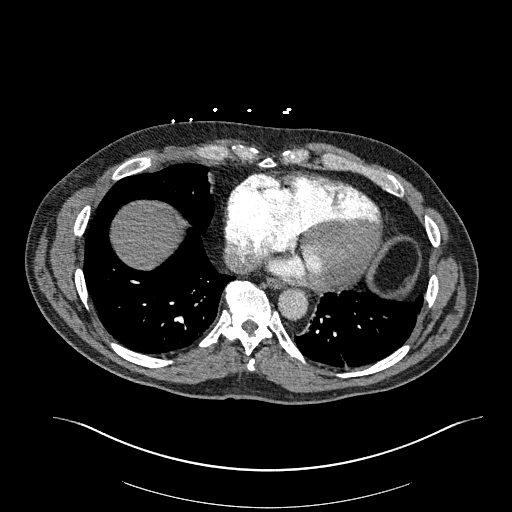
[im 91/298  lung]
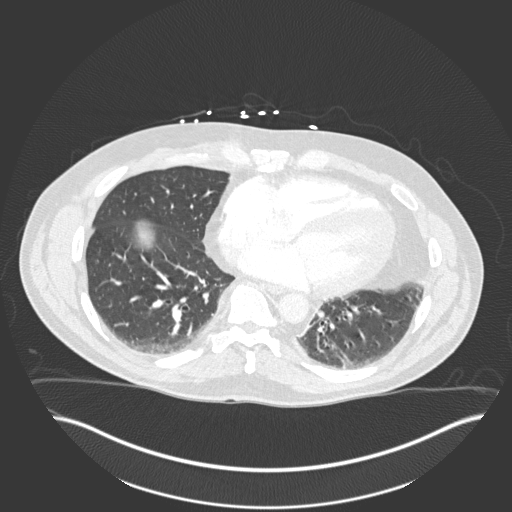
[im 117/298  soft-tissue]
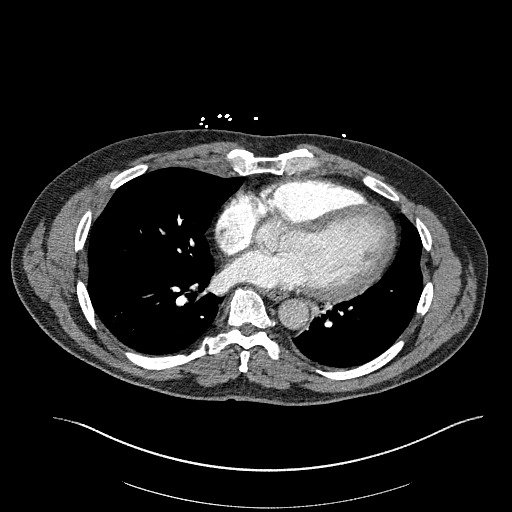
[im 130/298  lung]
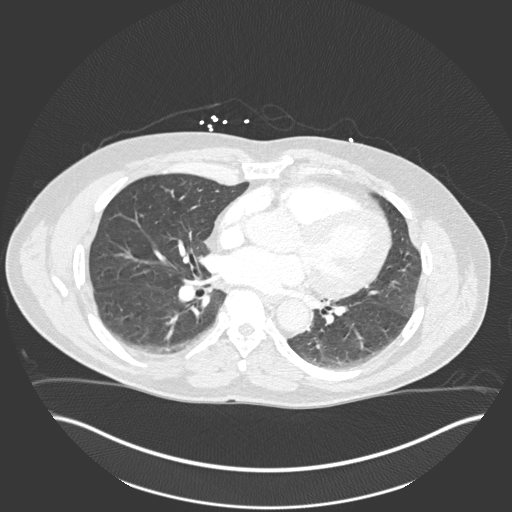
[im 155/298  soft-tissue]
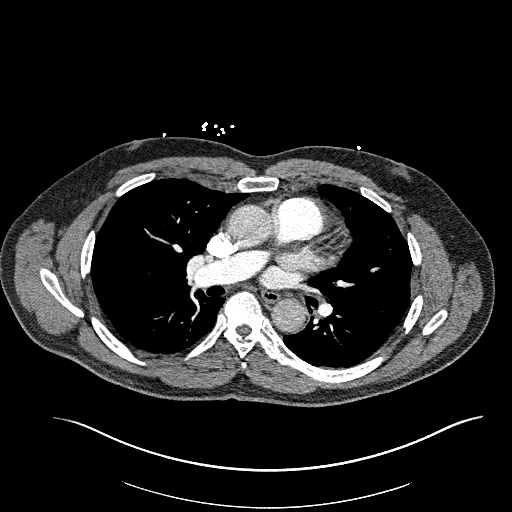
[im 168/298  lung]
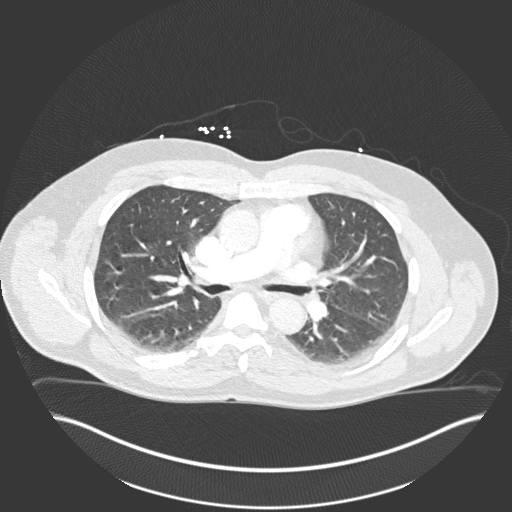
[im 181/298  soft-tissue]
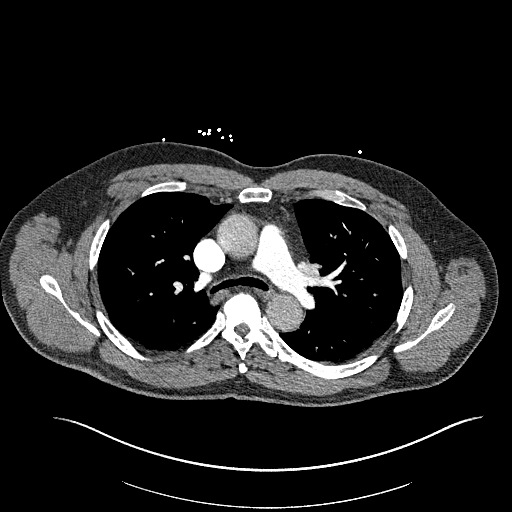
[im 207/298  lung]
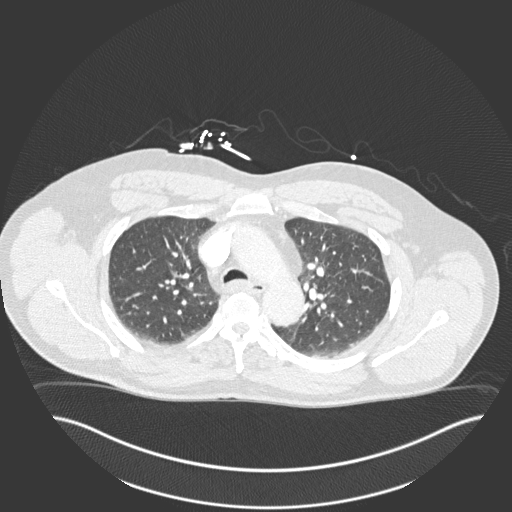
[im 220/298  soft-tissue]
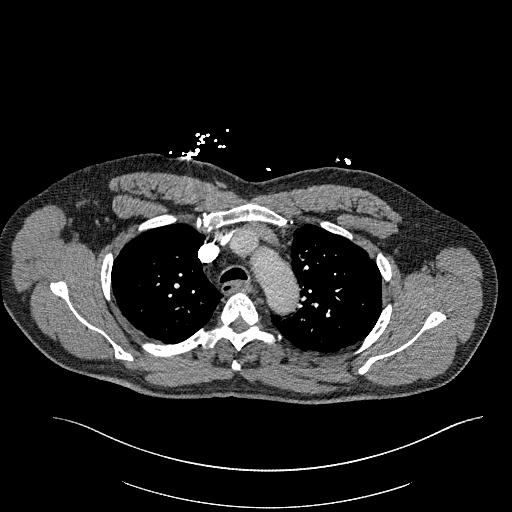
[im 246/298  lung]
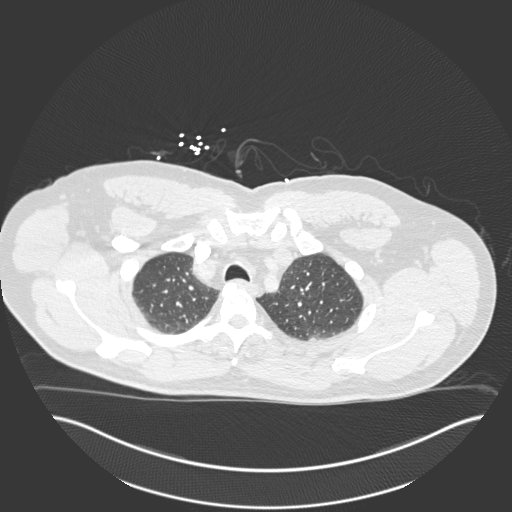
[im 259/298  soft-tissue]
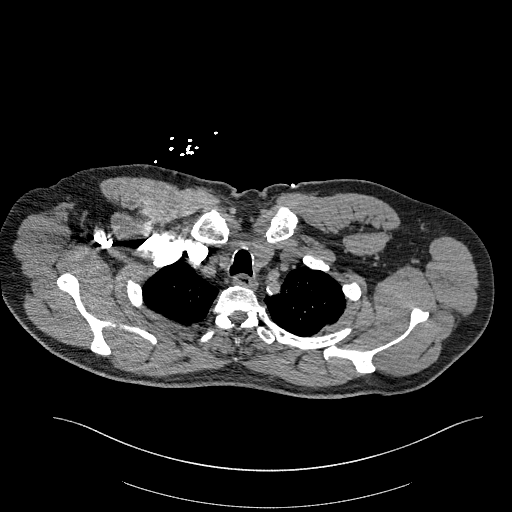
[im 285/298  lung]
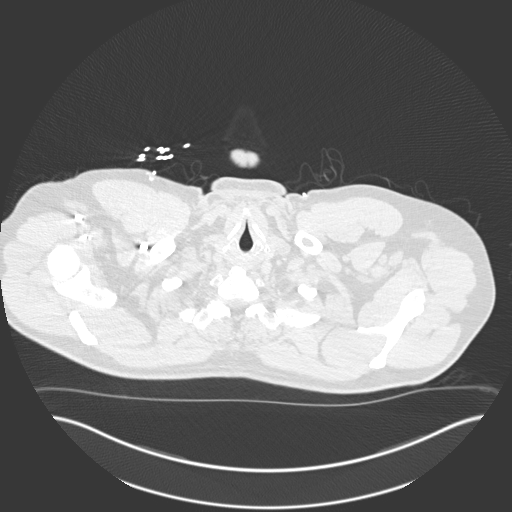

[Series 8: coronal mpr · coronal · 0.59mm/px · 3 of 137 slices shown]
[im 35/137  soft-tissue]
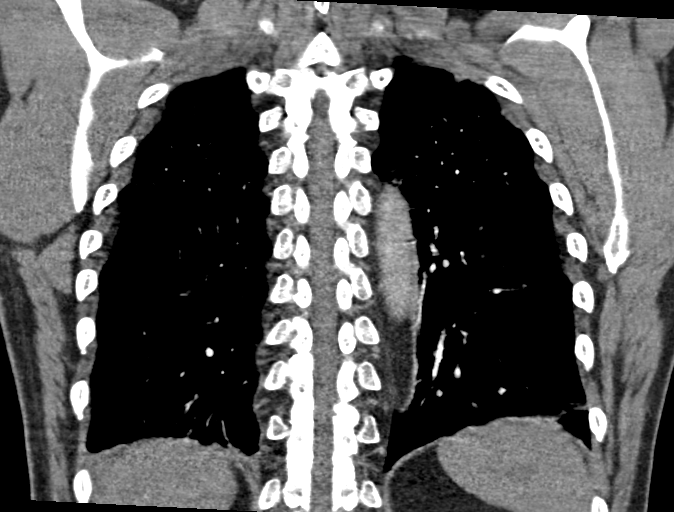
[im 69/137  soft-tissue]
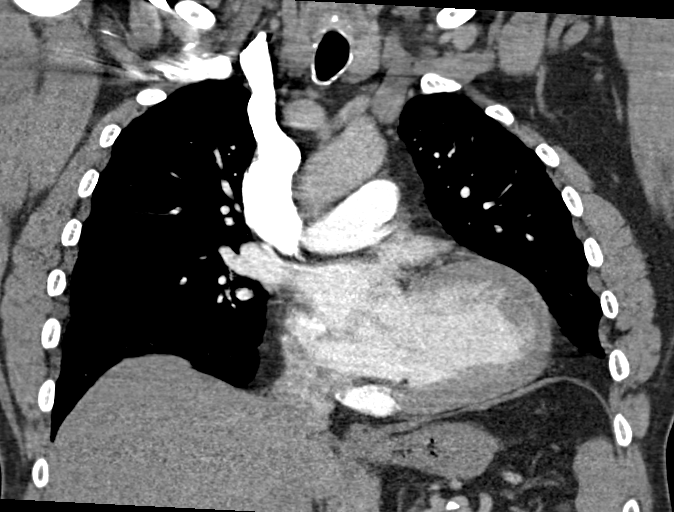
[im 103/137  soft-tissue]
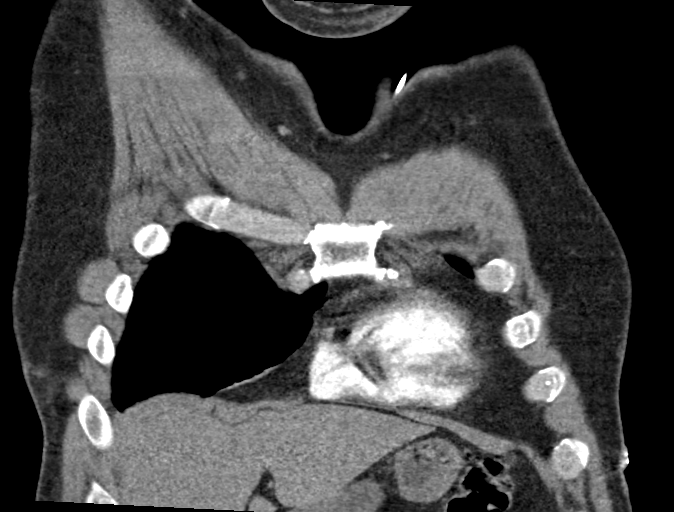

[18 of 46 positions shown; findings below may reference images not displayed]

RADIATION DOSE REDUCTION: This exam was performed according to the
departmental dose-optimization program which includes automated
exposure control, adjustment of the mA and/or kV according to
patient size and/or use of iterative reconstruction technique.

CONTRAST:  66mL OMNIPAQUE IOHEXOL 350 MG/ML SOLN
FINDINGS: Cardiovascular: There are no filling defects within the pulmonary
arterial tree to suggest the presence of pulmonary embolism. Heart
size is borderline enlarged. There is no significant pericardial
fluid, thickening or pericardial calcification. Aortic
atherosclerosis. No definite coronary artery calcifications.

Mediastinum/Nodes: No pathologically enlarged mediastinal or hilar
lymph nodes. Hilar esophagus is unremarkable in appearance. No
axillary lymphadenopathy.

Lungs/Pleura: No acute consolidative airspace disease. No pleural
effusions. No suspicious appearing pulmonary nodules or masses are
noted. Areas of dependent subsegmental atelectasis or mild scarring
are noted in the lower lobes of the lungs bilaterally.

Upper Abdomen: Unremarkable.

Musculoskeletal: There are no aggressive appearing lytic or blastic
lesions noted in the visualized portions of the skeleton.

Review of the MIP images confirms the above findings.
IMPRESSION: 1. No acute findings to account for the patient's symptoms.
Specifically, no evidence of pulmonary embolism.
2. Mild aortic atherosclerosis.

Aortic Atherosclerosis ([id number]-BX5.5).

## 2023-04-22 DIAGNOSIS — R351 Nocturia: Secondary | ICD-10-CM | POA: Diagnosis not present

## 2023-04-22 DIAGNOSIS — E291 Testicular hypofunction: Secondary | ICD-10-CM | POA: Diagnosis not present

## 2023-04-22 DIAGNOSIS — N401 Enlarged prostate with lower urinary tract symptoms: Secondary | ICD-10-CM | POA: Diagnosis not present

## 2023-05-02 DIAGNOSIS — E291 Testicular hypofunction: Secondary | ICD-10-CM | POA: Diagnosis not present

## 2023-05-02 DIAGNOSIS — N5201 Erectile dysfunction due to arterial insufficiency: Secondary | ICD-10-CM | POA: Diagnosis not present

## 2023-05-02 DIAGNOSIS — R351 Nocturia: Secondary | ICD-10-CM | POA: Diagnosis not present

## 2023-05-02 DIAGNOSIS — N401 Enlarged prostate with lower urinary tract symptoms: Secondary | ICD-10-CM | POA: Diagnosis not present

## 2023-06-03 DIAGNOSIS — E291 Testicular hypofunction: Secondary | ICD-10-CM | POA: Diagnosis not present

## 2023-07-04 DIAGNOSIS — E291 Testicular hypofunction: Secondary | ICD-10-CM | POA: Diagnosis not present

## 2023-07-19 DIAGNOSIS — M25531 Pain in right wrist: Secondary | ICD-10-CM | POA: Diagnosis not present

## 2023-08-13 DIAGNOSIS — E291 Testicular hypofunction: Secondary | ICD-10-CM | POA: Diagnosis not present

## 2023-08-14 ENCOUNTER — Emergency Department (HOSPITAL_COMMUNITY)

## 2023-08-14 ENCOUNTER — Emergency Department (HOSPITAL_COMMUNITY)
Admission: EM | Admit: 2023-08-14 | Discharge: 2023-08-14 | Disposition: A | Discharge status: Home / Self Care | Attending: Emergency Medicine | Admitting: Emergency Medicine

## 2023-08-14 ENCOUNTER — Other Ambulatory Visit: Payer: Self-pay

## 2023-08-14 ENCOUNTER — Encounter (HOSPITAL_COMMUNITY): Payer: Self-pay

## 2023-08-14 DIAGNOSIS — R071 Chest pain on breathing: Secondary | ICD-10-CM | POA: Diagnosis not present

## 2023-08-14 DIAGNOSIS — I10 Essential (primary) hypertension: Secondary | ICD-10-CM | POA: Diagnosis not present

## 2023-08-14 DIAGNOSIS — J90 Pleural effusion, not elsewhere classified: Secondary | ICD-10-CM | POA: Diagnosis not present

## 2023-08-14 DIAGNOSIS — J189 Pneumonia, unspecified organism: Secondary | ICD-10-CM | POA: Diagnosis not present

## 2023-08-14 DIAGNOSIS — R0602 Shortness of breath: Secondary | ICD-10-CM | POA: Diagnosis not present

## 2023-08-14 DIAGNOSIS — R918 Other nonspecific abnormal finding of lung field: Secondary | ICD-10-CM | POA: Diagnosis not present

## 2023-08-14 DIAGNOSIS — R0902 Hypoxemia: Secondary | ICD-10-CM | POA: Diagnosis not present

## 2023-08-14 DIAGNOSIS — Z79899 Other long term (current) drug therapy: Secondary | ICD-10-CM | POA: Insufficient documentation

## 2023-08-14 DIAGNOSIS — J9811 Atelectasis: Secondary | ICD-10-CM | POA: Diagnosis not present

## 2023-08-14 DIAGNOSIS — I517 Cardiomegaly: Secondary | ICD-10-CM | POA: Diagnosis not present

## 2023-08-14 DIAGNOSIS — R079 Chest pain, unspecified: Secondary | ICD-10-CM | POA: Diagnosis not present

## 2023-08-14 DIAGNOSIS — Z48813 Encounter for surgical aftercare following surgery on the respiratory system: Secondary | ICD-10-CM | POA: Diagnosis not present

## 2023-08-14 LAB — D-DIMER, QUANTITATIVE: D-Dimer, Quant: 2.22 ug{FEU}/mL — ABNORMAL HIGH (ref 0.00–0.50)

## 2023-08-14 LAB — CBC WITH DIFFERENTIAL/PLATELET
Abs Immature Granulocytes: 0.02 10*3/uL (ref 0.00–0.07)
Basophils Absolute: 0 10*3/uL (ref 0.0–0.1)
Basophils Relative: 1 %
Eosinophils Absolute: 0.2 10*3/uL (ref 0.0–0.5)
Eosinophils Relative: 3 %
HCT: 44 % (ref 39.0–52.0)
Hemoglobin: 13.6 g/dL (ref 13.0–17.0)
Immature Granulocytes: 0 %
Lymphocytes Relative: 15 %
Lymphs Abs: 0.9 10*3/uL (ref 0.7–4.0)
MCH: 26.3 pg (ref 26.0–34.0)
MCHC: 30.9 g/dL (ref 30.0–36.0)
MCV: 84.9 fL (ref 80.0–100.0)
Monocytes Absolute: 0.5 10*3/uL (ref 0.1–1.0)
Monocytes Relative: 8 %
Neutro Abs: 4.7 10*3/uL (ref 1.7–7.7)
Neutrophils Relative %: 73 %
Platelets: 290 10*3/uL (ref 150–400)
RBC: 5.18 MIL/uL (ref 4.22–5.81)
RDW: 14.8 % (ref 11.5–15.5)
WBC: 6.3 10*3/uL (ref 4.0–10.5)
nRBC: 0 % (ref 0.0–0.2)

## 2023-08-14 LAB — BASIC METABOLIC PANEL WITH GFR
Anion gap: 9 (ref 5–15)
BUN: 17 mg/dL (ref 8–23)
CO2: 26 mmol/L (ref 22–32)
Calcium: 9.1 mg/dL (ref 8.9–10.3)
Chloride: 101 mmol/L (ref 98–111)
Creatinine, Ser: 1.03 mg/dL (ref 0.61–1.24)
GFR, Estimated: >60 mL/min (ref >60)
Glucose, Bld: 99 mg/dL (ref 70–99)
Potassium: 4.3 mmol/L (ref 3.5–5.1)
Sodium: 136 mmol/L (ref 135–145)

## 2023-08-14 LAB — BODY FLUID CELL COUNT WITH DIFFERENTIAL
Eos, Fluid: 7 %
Lymphs, Fluid: 68 %
Monocyte-Macrophage-Serous Fluid: 21 % — ABNORMAL LOW (ref 50–90)
Neutrophil Count, Fluid: 4 % (ref 0–25)
Total Nucleated Cell Count, Fluid: 6012 uL — ABNORMAL HIGH (ref 0–1000)

## 2023-08-14 LAB — LACTATE DEHYDROGENASE, PLEURAL OR PERITONEAL FLUID: LD, Fluid: 709 U/L — ABNORMAL HIGH (ref 3–23)

## 2023-08-14 LAB — PROTEIN, PLEURAL OR PERITONEAL FLUID: Total protein, fluid: 4.8 g/dL

## 2023-08-14 LAB — ALBUMIN, PLEURAL OR PERITONEAL FLUID: Albumin, Fluid: 2.3 g/dL

## 2023-08-14 LAB — GLUCOSE, PLEURAL OR PERITONEAL FLUID: Glucose, Fluid: 32 mg/dL

## 2023-08-14 LAB — BRAIN NATRIURETIC PEPTIDE: B Natriuretic Peptide: 32.9 pg/mL (ref 0.0–100.0)

## 2023-08-14 LAB — TROPONIN I (HIGH SENSITIVITY): Troponin I (High Sensitivity): 3 ng/L (ref <18)

## 2023-08-14 MED ORDER — AMOXICILLIN-POT CLAVULANATE 875-125 MG PO TABS: 1.0000 | ORAL_TABLET | Freq: Two times a day (BID) | ORAL | 0 refills | Status: AC

## 2023-08-14 MED ORDER — AMOXICILLIN-POT CLAVULANATE 875-125 MG PO TABS: 1.0000 | ORAL_TABLET | Freq: Two times a day (BID) | ORAL | 0 refills | Status: DC

## 2023-08-14 MED ORDER — LIDOCAINE HCL 2 % IJ SOLN
10.0000 mL | Freq: Once | INTRAMUSCULAR | Status: AC
  Administered 2023-08-14: 200 mg
  Filled 2023-08-14: qty 20

## 2023-08-14 MED ORDER — AMOXICILLIN-POT CLAVULANATE 875-125 MG PO TABS
1.0000 | ORAL_TABLET | Freq: Once | ORAL | Status: AC
  Administered 2023-08-14: 1 via ORAL
  Filled 2023-08-14: qty 1

## 2023-08-14 MED ORDER — IOHEXOL 350 MG/ML SOLN
75.0000 mL | Freq: Once | INTRAVENOUS | Status: AC | PRN
  Administered 2023-08-14: 75 mL via INTRAVENOUS

## 2023-08-14 NOTE — ED Triage Notes (Signed)
 Chest tightness in middle of chest that started today at 1100, went to [facility] and sent here for further workup. Pt has exertional dyspnea

## 2023-08-14 NOTE — ED Provider Notes (Signed)
 Physical Exam  BP 125/80   Pulse 70   Temp 98.3 F (36.8 C) (Oral)   Resp 16   Ht 6\' 1"  (1.854 m)   Wt 104.3 kg   SpO2 95%   BMI 30.34 kg/m   Physical Exam  Procedures  THORACENTESIS BEDSIDE  Date/Time: 08/14/2023 9:07 PM  Performed by: Dalene Duck, MD Authorized by: Dalene Duck, MD   Consent:    Consent obtained:  Verbal   Consent given by:  Patient   Risks, benefits, and alternatives were discussed: yes     Risks discussed:  Bleeding and infection   Alternatives discussed:  No treatment Universal protocol:    Procedure explained and questions answered to patient or proxy's satisfaction: yes     Patient identity confirmed:  Verbally with patient Sedation:    Sedation type:  None Anesthesia:    Anesthesia method:  None Procedure details:    Preparation: Patient was prepped and draped in usual sterile fashion     Patient position:  Sitting   Location:  R midscapular line   Intercostal space:  7th   Puncture method:  Over-the-needle catheter   Ultrasound guidance: yes     Indwelling catheter placed: yes     Needle gauge:  18   Catheter size:  16 G   Number of attempts:  1   Drainage characteristics:  Serosanguinous Post-procedure details:    Chest x-ray performed: yes     Chest x-ray findings:  Pleural effusion improved   Procedure completion:  Tolerated Comments:     1 L serosanguinous fluid came out    ED Course / MDM   Clinical Course as of 08/14/23 2106  Wed Aug 14, 2023  1540 D-dimer elevated, will need CTPE. [VK]  1621 Large pleural effusion seen on CT, formal read pending. Patient signed out to Dr. Delana Favors pending CT read and possible thoracentesis. [VK]    Clinical Course User Index [VK] Kingsley, Victoria K, DO   Medical Decision Making Care assumed at 4 PM.  Patient is here with she was repaired.  CT scan was pending at signout and it showed large right pleural effusion.  Signed out pending CT read and likely thoracentesis  5 pm I  performed thoracentesis with about 1 L serosanguineous fluid came out.  Fluid was sent for analysis.  Will also get post thoracentesis chest x-ray  9:09 PM Post thoracentesis chest x-ray showed decreased effusion with no obvious pneumothorax.  I reviewed patient's thoracentesis fluid and white blood cell count is 6000 and mostly macrocytes.  Patient also has elevated LDH.  Reports patient has an exudate and it is either from parapneumonic effusion versus malignancy.  I have sent off for cytology.  Discussed with Dr. Bernetta Brilliant from pulmonology.  He recommend Augmentin twice daily for 10 days and pulmonology follow-up.  Patient has no oxygen requirement and would rather go home and then be admitted.  He is traveling to [place] in several weeks I told him that he needs to be cleared by pulmonology get a repeat chest x-ray in a week.  Also encouraged him to follow-up with PCP as well.  Patient also needs to get his pleural effusion culture and cytology results followed up   Problems Addressed: Community acquired pneumonia of right middle lobe of lung: acute illness or injury Pleural effusion: acute illness or injury  Amount and/or Complexity of Data Reviewed Labs: ordered. Decision-making details documented in ED Course. Radiology: ordered and independent interpretation performed.  Decision-making details documented in ED Course.  Risk Prescription drug management.          Dalene Duck, MD 08/14/23 2111

## 2023-08-14 NOTE — ED Provider Notes (Signed)
 [place] EMERGENCY DEPARTMENT AT [facility] Provider Note   CSN: [id number] Arrival date & time: 08/14/23  1350     History  Chief Complaint  Patient presents with   Chest Pain    Barry Barry is a 68 y.o. male.  Patient is a 68 year old male with a past medical history of hypertension, hyperlipidemia, ED and testosterone replacement therapy presenting to the emergency department with shortness of breath.  Patient states earlier this morning he was finishing a meeting when he suddenly developed shortness of breath.  He states that he felt like he was unable to get a deep breath then.  He reports only about 1 out of 10 associated chest pain.  He states his shortness of breath is worse with exertion.  He denies any associated fever or cough.  He states he had some mild swelling in his ankles.  States that he went to his primary doctor who recommended he come to the ER for further evaluation.  He denies any history of VTE, any recent hospitalization or surgery, any recent travel in the car or plane or cancer history.  The history is provided by the patient.  Chest Pain      Home Medications Prior to Admission medications   Medication Sig Start Date End Date Taking? Authorizing Provider  amLODipine (NORVASC) 5 MG tablet Take 5 mg by mouth daily. 11/27/19   [provider]  anastrozole (ARIMIDEX) 1 MG tablet Take 1 mg by mouth daily. 12/12/19   [provider]  Loratadine 10 MG CAPS Take 10 mg by mouth daily. 02/16/18   [provider]  naproxen sodium (ALEVE) 220 MG tablet Take 440 mg by mouth 2 (two) times daily as needed (pain).    [provider]  tadalafil (CIALIS) 5 MG tablet Take 5 mg by mouth daily.    [provider]  testosterone cypionate (DEPOTESTOSTERONE CYPIONATE) 200 MG/ML injection Inject 200 mg into the muscle every Sunday. 05/09/21   [provider]      Allergies    Patient has no known allergies.     Review of Systems   Review of Systems  Cardiovascular:  Positive for chest pain.    Physical Exam Updated Vital Signs BP (!) 156/84 (BP Location: Right Arm)   Pulse 73   Temp 98.4 F (36.9 C) (Oral)   Resp 16   Ht 6\' 1"  (1.854 m)   Wt 104.3 kg   SpO2 97%   BMI 30.34 kg/m  Physical Exam Vitals and nursing note reviewed.  Constitutional:      General: He is not in acute distress.    Appearance: He is well-developed.  HENT:     Head: Normocephalic.  Eyes:     Extraocular Movements: Extraocular movements intact.  Cardiovascular:     Rate and Rhythm: Normal rate and regular rhythm.     Heart sounds: Normal heart sounds.  Pulmonary:     Effort: Pulmonary effort is normal.     Breath sounds: Examination of the right-lower field reveals rales. Examination of the left-lower field reveals rales. Rales present.  Abdominal:     Palpations: Abdomen is soft.     Tenderness: There is no abdominal tenderness.  Musculoskeletal:        General: Normal range of motion.     Cervical back: Normal range of motion and neck supple.     Right lower leg: Edema (trace non-pitting to ankles) present.     Left lower leg:  Edema (trace non-pitting to ankles) present.  Skin:    General: Skin is warm and dry.  Neurological:     General: No focal deficit present.     Mental Status: He is alert and oriented to person, place, and time.  Psychiatric:        Mood and Affect: Mood normal.        Behavior: Behavior normal.     ED Results / Procedures / Treatments   Labs (all labs ordered are listed, but only abnormal results are displayed) Labs Reviewed  D-DIMER, QUANTITATIVE - Abnormal; Notable for the following components:      Result Value   D-Dimer, Quant 2.22 (*)    All other components within normal limits  BASIC METABOLIC PANEL WITH GFR  CBC WITH DIFFERENTIAL/PLATELET  BRAIN NATRIURETIC PEPTIDE  TROPONIN I (HIGH SENSITIVITY)  TROPONIN I (HIGH SENSITIVITY)    EKG EKG  Interpretation Date/Time:  Wednesday Aug 14 2023 14:00:15 EDT Ventricular Rate:  74 PR Interval:  218 QRS Duration:  98 QT Interval:  363 QTC Calculation: 403 R Axis:   74  Text Interpretation: Sinus rhythm Borderline prolonged PR interval Anterior infarct, old No significant change since last tracing Confirmed by Celesta Coke ([id number]) on 08/14/2023 2:08:09 PM  Radiology No results found.  Procedures Procedures    Medications Ordered in ED Medications  iohexol  (OMNIPAQUE ) 350 MG/ML injection 75 mL (75 mLs Intravenous Contrast Given 08/14/23 1600)    ED Course/ Medical Decision Making/ A&P Clinical Course as of 08/14/23 1622  Wed Aug 14, 2023  1540 D-dimer elevated, will need CTPE. [VK]  1621 Large pleural effusion seen on CT, formal read pending. Patient signed out to Dr. Delana Favors pending CT read and possible thoracentesis. [VK]    Clinical Course User Index [VK] Kingsley, Marticia Reifschneider K, DO                                 Medical Decision Making This patient presents to the ED with chief complaint(s) of SOB with pertinent past medical history of HTN, HLD, ED on testosterone which further complicates the presenting complaint. The complaint involves an extensive differential diagnosis and also carries with it a high risk of complications and morbidity.    The differential diagnosis includes ACS, arrhythmia, anemia, pneumonia, pneumothorax, pulmonary edema, pleural effusion, considering PE with his hormone use though he is otherwise low risk by Wells criteria  Additional history obtained: Additional history obtained from family Records reviewed Care Everywhere/External Records and Primary Care Documents  ED Course and Reassessment: On patient's arrival he is hemodynamically stable in no acute distress, was apparently satting 93% at PCP office but was satting 97 and above in the ED here.  EKG on arrival showed normal sinus rhythm without acute ischemic changes.  He will have labs  including troponin and D-dimer and chest x-ray performed and will be closely reassessed.  Independent labs interpretation:  The following labs were independently interpreted: elevated d-dimer, otherwise within normal range  Independent visualization of imaging: - I independently visualized the following imaging with scope of interpretation limited to determining acute life threatening conditions related to emergency care: CXR, CTPE, which revealed R-sided pleural effusion, formal read pending   Amount and/or Complexity of Data Reviewed Labs: ordered. Radiology: ordered.  Risk Prescription drug management.          Final Clinical Impression(s) / ED Diagnoses Final diagnoses:  None    Rx /  DC Orders ED Discharge Orders     None         Nolberto Batty, [place] 08/14/23 1622

## 2023-08-14 NOTE — Discharge Instructions (Addendum)
 You have a large pleural effusion.  We have sent out multiple lab work and the main concern is either an infection versus cancer.  I have prescribed Augmentin twice daily for 10 days   You have multiple lab work that will require several days to come back.  Please call [facility] for follow-up in a week.  They should be able to see all the lab work and you will need another x-ray in a week  Return to ER if you have worse shortness of breath or chest pain or fever

## 2023-08-15 ENCOUNTER — Telehealth: Payer: Self-pay

## 2023-08-15 LAB — AMYLASE, PLEURAL OR PERITONEAL FLUID: Amylase, Fluid: 35 U/L

## 2023-08-15 NOTE — Telephone Encounter (Signed)
 Called patient to get scheduled for hospital follow up. E2C2 unable to find an appointment within patient's range.

## 2023-08-16 LAB — CYTOLOGY - NON PAP

## 2023-08-17 DIAGNOSIS — M170 Bilateral primary osteoarthritis of knee: Secondary | ICD-10-CM | POA: Diagnosis not present

## 2023-08-26 ENCOUNTER — Other Ambulatory Visit (HOSPITAL_COMMUNITY)
Admission: RE | Admit: 2023-08-26 | Discharge: 2023-08-26 | Disposition: A | Discharge status: Home / Self Care | Source: Ambulatory Visit | Attending: Pulmonary Disease | Admitting: Pulmonary Disease

## 2023-08-26 ENCOUNTER — Ambulatory Visit: Admitting: Pulmonary Disease

## 2023-08-26 ENCOUNTER — Encounter: Payer: Self-pay | Admitting: Pulmonary Disease

## 2023-08-26 ENCOUNTER — Ambulatory Visit (HOSPITAL_COMMUNITY)
Admission: RE | Admit: 2023-08-26 | Discharge: 2023-08-26 | Disposition: A | Discharge status: Home / Self Care | Source: Ambulatory Visit | Attending: Pulmonary Disease | Admitting: Pulmonary Disease

## 2023-08-26 ENCOUNTER — Other Ambulatory Visit: Payer: Self-pay

## 2023-08-26 VITALS — BP 110/60 | HR 73 | Temp 98.8°F | Ht 73.0 in | Wt 231.2 lb

## 2023-08-26 DIAGNOSIS — J90 Pleural effusion, not elsewhere classified: Secondary | ICD-10-CM | POA: Insufficient documentation

## 2023-08-26 DIAGNOSIS — Z87891 Personal history of nicotine dependence: Secondary | ICD-10-CM | POA: Diagnosis not present

## 2023-08-26 DIAGNOSIS — M05761 Rheumatoid arthritis with rheumatoid factor of right knee without organ or systems involvement: Secondary | ICD-10-CM | POA: Diagnosis not present

## 2023-08-26 DIAGNOSIS — M05762 Rheumatoid arthritis with rheumatoid factor of left knee without organ or systems involvement: Secondary | ICD-10-CM

## 2023-08-26 DIAGNOSIS — J9811 Atelectasis: Secondary | ICD-10-CM | POA: Diagnosis not present

## 2023-08-26 LAB — CBC WITH DIFFERENTIAL/PLATELET
Abs Immature Granulocytes: 0.15 10*3/uL — ABNORMAL HIGH (ref 0.00–0.07)
Basophils Absolute: 0 10*3/uL (ref 0.0–0.1)
Basophils Relative: 1 %
Eosinophils Absolute: 0.3 10*3/uL (ref 0.0–0.5)
Eosinophils Relative: 3 %
HCT: 45 % (ref 39.0–52.0)
Hemoglobin: 13.6 g/dL (ref 13.0–17.0)
Immature Granulocytes: 2 %
Lymphocytes Relative: 15 %
Lymphs Abs: 1.3 10*3/uL (ref 0.7–4.0)
MCH: 25.9 pg — ABNORMAL LOW (ref 26.0–34.0)
MCHC: 30.2 g/dL (ref 30.0–36.0)
MCV: 85.6 fL (ref 80.0–100.0)
Monocytes Absolute: 0.5 10*3/uL (ref 0.1–1.0)
Monocytes Relative: 5 %
Neutro Abs: 6.5 10*3/uL (ref 1.7–7.7)
Neutrophils Relative %: 74 %
Platelets: 406 10*3/uL — ABNORMAL HIGH (ref 150–400)
RBC: 5.26 MIL/uL (ref 4.22–5.81)
RDW: 15.4 % (ref 11.5–15.5)
WBC: 8.7 10*3/uL (ref 4.0–10.5)
nRBC: 0 % (ref 0.0–0.2)

## 2023-08-26 MED ORDER — CELECOXIB 100 MG PO CAPS: 100.0000 mg | ORAL_CAPSULE | Freq: Two times a day (BID) | ORAL | 2 refills | Status: DC

## 2023-08-26 NOTE — Progress Notes (Unsigned)
 Subjective:    Patient ID: Barry Barry, male    DOB: 04/22/1955, 68 y.o.   MRN: [id number]  Patient Care Team: Lanae Pinal, MD as PCP - General (Family Medicine)  Chief Complaint  Patient presents with  . New Patient (Initial Visit)    Pleural effusion of right lung, 5/28: PE, PNA, x-ray, CTA, had a thoracentesis  History of hard fall and bruised lungs/ribs in March 2023, in the past two months has had increased joint inflammation -- had a rheumatoid arthritis test via [facility] which resulted in mixed results, has switched to oral testosterone in March of 2025 which works with his lymphatic system. No current cough, very minimal SOB/DOE.     BACKGROUND:   HPI    Review of Systems A 10 point review of systems was performed and it is as noted above otherwise negative.   Past Medical History:  Diagnosis Date  . Erectile dysfunction   . Hyperlipidemia   . Hypertension     Past Surgical History:  Procedure Laterality Date  . SKIN SURGERY      There are no active problems to display for this patient.   Family History  Problem Relation Age of Onset  . Heart attack Paternal Grandfather     Social History   Tobacco Use  . Smoking status: Former    Current packs/day: 0.00    Types: Cigarettes    Quit date: 08/26/1979    Years since quitting: 44.0  . Smokeless tobacco: Never  . Tobacco comments:    Smoked 1 ppd for 44 years 08/26/23  Substance Use Topics  . Alcohol use: Not Currently    Alcohol/week: 2.0 standard drinks of alcohol    Types: 2 Cans of beer per week    No Known Allergies  Current Meds  Medication Sig  . amLODipine (NORVASC) 5 MG tablet Take 5 mg by mouth daily.  Aaron Aas anastrozole (ARIMIDEX) 1 MG tablet Take 1 mg by mouth daily.  . celecoxib (CELEBREX) 100 MG capsule Take 1 capsule (100 mg total) by mouth 2 (two) times daily.  . diclofenac (CATAFLAM) 50 MG tablet Take 50 mg by mouth daily as needed.  Aaron Aas KYZATREX 150 MG CAPS Take 2 capsules by mouth  2 (two) times daily.  . Loratadine 10 MG CAPS Take 10 mg by mouth daily.  . naproxen sodium (ALEVE) 220 MG tablet Take 440 mg by mouth 2 (two) times daily as needed (pain).  . tadalafil (CIALIS) 5 MG tablet Take 5 mg by mouth daily.  . XYOSTED 100 MG/0.5ML SOAJ SMARTSIG:1 pre-filled pen syringe IM Once a Week    Immunization History  Administered Date(s) Administered  . Covid-19 Iv Non-us  Vaccine (Bibp, Sinopharm) 06/04/2019, 06/25/2019  . Fluad Quad(high Dose 65+) 12/01/2021  . Influenza, Quadrivalent, Recombinant, Inj, Pf 12/21/2017  . Influenza-Unspecified 01/18/2023  . Moderna SARS-COV2 Booster Vaccination 01/18/2023  . PFIZER(Purple Top)SARS-COV-2 Vaccination 06/04/2019  . PNEUMOCOCCAL CONJUGATE-20 12/01/2021  . Zoster Recombinant(Shingrix) 10/12/2017, 01/18/2018        Objective:     BP 110/60 (BP Location: Left Arm, Patient Position: Sitting, Cuff Size: Large)   Pulse 73   Temp 98.8 F (37.1 C) (Oral)   Ht 6\' 1"  (1.854 m)   Wt 231 lb 3.2 oz (104.9 kg)   SpO2 94%   BMI 30.50 kg/m   SpO2: 94 %  GENERAL: HEAD: Normocephalic, atraumatic.  EYES: Pupils equal, round, reactive to light.  No scleral icterus.  MOUTH:  NECK: Supple. No thyromegaly.  Trachea midline. No JVD.  No adenopathy. PULMONARY: Good air entry bilaterally.  No adventitious sounds. CARDIOVASCULAR: S1 and S2. Regular rate and rhythm.  ABDOMEN: MUSCULOSKELETAL: No joint deformity, no clubbing, no edema.  NEUROLOGIC:  SKIN: Intact,warm,dry. PSYCH:  POCUS       Assessment & Plan:     ICD-10-CM   1. Pleural effusion  J90 CBC with Differential/Platelet    DG Chest 2 View    2. 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 DG Chest 2 View    Standing Status:   Future    Expiration Date:   08/25/2024    Reason  for Exam (SYMPTOM  OR DIAGNOSIS REQUIRED):   Pleural effusion, right    Preferred imaging location?:   [facility]  . CBC with Differential/Platelet    Standing Status:   Future    Expected Date:   08/26/2023    Expiration Date:   08/25/2024  . Rheumatoid Factor    Standing Status:   Future    Expiration Date:   08/25/2024  . ANA, IFA Comprehensive Panel    Standing Status:   Future    Expiration Date:   08/25/2024    Meds ordered this encounter  Medications  . celecoxib (CELEBREX) 100 MG capsule    Sig: Take 1 capsule (100 mg total) by mouth 2 (two) times daily.    Dispense:  60 capsule    Refill:  2     Advised if symptoms do not improve or worsen, to please contact office for sooner follow up or seek emergency care.    I spent xxx minutes of dedicated to the care of this patient on the date of this encounter to include pre-visit review of records, face-to-face time with the patient discussing conditions above, post visit ordering of testing, clinical documentation with the electronic health record, making appropriate referrals as documented, and communicating necessary findings to members of the patients care team.   C. Chloe Counter, MD Advanced Bronchoscopy PCCM [facility]    *This note was dictated using voice recognition software/Dragon.  Despite best efforts to proofread, errors can occur which can change the meaning. Any transcriptional errors that result from this process are unintentional and may not be fully corrected at the time of dictation.

## 2023-08-26 NOTE — Patient Instructions (Addendum)
 VISIT SUMMARY:  Today, we discussed your recent health issues, including the right pleural effusion and suspected rheumatoid arthritis. We reviewed your symptoms, recent medical history, and current concerns, especially regarding your upcoming trip to [place].  YOUR PLAN:  -RIGHT PLEURAL EFFUSION: Right pleural effusion is a condition where excess fluid builds up around the lung, causing difficulty breathing. Your recent thoracentesis helped remove this fluid, and we suspect the cause is related to inflammation from rheumatoid arthritis. We will start you on anti-inflammatory medication to prevent the fluid from coming back and to reduce inflammation. A chest x-ray will be ordered in [place] to check your current fluid status.  -RHEUMATOID ARTHRITIS: Rheumatoid arthritis is an autoimmune condition that causes joint pain and inflammation. You have been experiencing pain in your knees and wrists, and a test showed a positive result for rheumatoid arthritis.  We will double check this result.  We will start you on anti-inflammatory medication to help with the joint pain and inflammation. Additionally, your primary care physician should refer you to a rheumatologist for further evaluation and management. We will also communicate with your primary care physician about your current findings and treatment plan.  INSTRUCTIONS:  Please schedule a chest x-ray in [place] to check your current fluid status. Additionally, follow up with a rheumatologist for further evaluation and management of your rheumatoid arthritis. We will communicate with your primary care physician about your current findings and treatment plan.

## 2023-08-27 ENCOUNTER — Other Ambulatory Visit: Payer: Self-pay | Admitting: Pulmonary Disease

## 2023-08-27 ENCOUNTER — Encounter: Payer: Self-pay | Admitting: Pulmonary Disease

## 2023-08-27 DIAGNOSIS — M05762 Rheumatoid arthritis with rheumatoid factor of left knee without organ or systems involvement: Secondary | ICD-10-CM

## 2023-08-27 DIAGNOSIS — J90 Pleural effusion, not elsewhere classified: Secondary | ICD-10-CM | POA: Insufficient documentation

## 2023-08-27 LAB — ANA COMPREHENSIVE PANEL
Anti JO-1: <0.2 AI (ref 0.0–0.9)
Centromere Ab Screen: <0.2 AI (ref 0.0–0.9)
Chromatin Ab SerPl-aCnc: <0.2 AI (ref 0.0–0.9)
ENA SM Ab Ser-aCnc: <0.2 AI (ref 0.0–0.9)
Ribonucleic Protein: <0.2 AI (ref 0.0–0.9)
SSA (Ro) (ENA) Antibody, IgG: <0.2 AI (ref 0.0–0.9)
SSB (La) (ENA) Antibody, IgG: <0.2 AI (ref 0.0–0.9)
Scleroderma (Scl-70) (ENA) Antibody, IgG: <0.2 AI (ref 0.0–0.9)
ds DNA Ab: 1 [IU]/mL (ref 0–9)

## 2023-08-27 LAB — RHEUMATOID FACTOR: Rheumatoid fact SerPl-aCnc: 78 [IU]/mL — ABNORMAL HIGH (ref <14.0)

## 2023-08-27 NOTE — Progress Notes (Signed)
 Reviewed laboratory data obtained at [facility].  Patient's connective tissue panel was negative HOWEVER, rheumatoid factor elevated at 78.  Patient's history fits rheumatoid arthritis history.  Patient notes marked improvement on symptoms with Celebrex.  Chest x-ray ray shows some reaccumulation of pleural effusion on the right, will proceed to schedule for thoracentesis by IR.  Patient lives in [place] and prefers having procedure done in [place].   Acey Ace, MD Advanced Bronchoscopy PCCM [facility]

## 2023-08-27 NOTE — Addendum Note (Signed)
 Addended by: Marc Senior on: 08/27/2023 05:20 PM   Modules accepted: Orders

## 2023-08-28 ENCOUNTER — Other Ambulatory Visit (HOSPITAL_COMMUNITY)
Admission: RE | Admit: 2023-08-28 | Discharge: 2023-08-28 | Disposition: A | Discharge status: Home / Self Care | Source: Ambulatory Visit | Attending: Pulmonary Disease | Admitting: Pulmonary Disease

## 2023-08-28 DIAGNOSIS — J90 Pleural effusion, not elsewhere classified: Secondary | ICD-10-CM | POA: Diagnosis not present

## 2023-08-28 DIAGNOSIS — M05761 Rheumatoid arthritis with rheumatoid factor of right knee without organ or systems involvement: Secondary | ICD-10-CM | POA: Insufficient documentation

## 2023-08-28 DIAGNOSIS — M05762 Rheumatoid arthritis with rheumatoid factor of left knee without organ or systems involvement: Secondary | ICD-10-CM | POA: Diagnosis not present

## 2023-08-29 LAB — RHEUMATOID FACTOR: Rheumatoid fact SerPl-aCnc: 76 [IU]/mL — ABNORMAL HIGH (ref <14.0)

## 2023-09-02 ENCOUNTER — Ambulatory Visit (HOSPITAL_COMMUNITY)
Admission: RE | Admit: 2023-09-02 | Discharge: 2023-09-02 | Disposition: A | Discharge status: Home / Self Care | Source: Ambulatory Visit | Attending: Radiology | Admitting: Radiology

## 2023-09-02 ENCOUNTER — Ambulatory Visit (HOSPITAL_COMMUNITY)
Admission: RE | Admit: 2023-09-02 | Discharge: 2023-09-02 | Disposition: A | Discharge status: Home / Self Care | Source: Ambulatory Visit | Attending: Pulmonary Disease | Admitting: Pulmonary Disease

## 2023-09-02 DIAGNOSIS — Z48813 Encounter for surgical aftercare following surgery on the respiratory system: Secondary | ICD-10-CM | POA: Diagnosis not present

## 2023-09-02 DIAGNOSIS — M05762 Rheumatoid arthritis with rheumatoid factor of left knee without organ or systems involvement: Secondary | ICD-10-CM | POA: Diagnosis not present

## 2023-09-02 DIAGNOSIS — J90 Pleural effusion, not elsewhere classified: Secondary | ICD-10-CM | POA: Diagnosis not present

## 2023-09-02 DIAGNOSIS — M05761 Rheumatoid arthritis with rheumatoid factor of right knee without organ or systems involvement: Secondary | ICD-10-CM | POA: Diagnosis not present

## 2023-09-02 LAB — BODY FLUID CELL COUNT WITH DIFFERENTIAL
Eos, Fluid: 4 %
Lymphs, Fluid: 63 %
Monocyte-Macrophage-Serous Fluid: 8 % — ABNORMAL LOW (ref 50–90)
Neutrophil Count, Fluid: 25 % (ref 0–25)
Total Nucleated Cell Count, Fluid: 3731 uL — ABNORMAL HIGH (ref 0–1000)

## 2023-09-02 LAB — LACTATE DEHYDROGENASE, PLEURAL OR PERITONEAL FLUID: LD, Fluid: 685 U/L — ABNORMAL HIGH (ref 3–23)

## 2023-09-02 LAB — PROTEIN, PLEURAL OR PERITONEAL FLUID: Total protein, fluid: 4.7 g/dL

## 2023-09-02 LAB — GLUCOSE, PLEURAL OR PERITONEAL FLUID: Glucose, Fluid: 26 mg/dL

## 2023-09-02 MED ORDER — LIDOCAINE HCL 1 % IJ SOLN
INTRAMUSCULAR | Status: AC
  Filled 2023-09-02: qty 20

## 2023-09-02 NOTE — Procedures (Signed)
 Ultrasound-guided diagnostic and therapeutic right thoracentesis performed yielding 830 cc of slightly hazy, yellow fluid. No immediate complications. Follow-up chest x-ray pending.  A portion of the fluid was submitted to the lab for preordered studies. EBL none.

## 2023-09-03 ENCOUNTER — Encounter: Payer: Self-pay | Admitting: Rheumatology

## 2023-09-03 ENCOUNTER — Ambulatory Visit (INDEPENDENT_AMBULATORY_CARE_PROVIDER_SITE_OTHER)

## 2023-09-03 ENCOUNTER — Ambulatory Visit

## 2023-09-03 ENCOUNTER — Telehealth: Payer: Self-pay | Admitting: Pharmacist

## 2023-09-03 ENCOUNTER — Ambulatory Visit: Payer: Self-pay | Admitting: Pulmonary Disease

## 2023-09-03 ENCOUNTER — Ambulatory Visit: Attending: Rheumatology | Admitting: Rheumatology

## 2023-09-03 ENCOUNTER — Other Ambulatory Visit: Payer: Self-pay | Admitting: Pulmonary Disease

## 2023-09-03 VITALS — BP 120/73 | HR 80 | Resp 14 | Ht 72.75 in | Wt 227.0 lb

## 2023-09-03 DIAGNOSIS — Z79899 Other long term (current) drug therapy: Secondary | ICD-10-CM

## 2023-09-03 DIAGNOSIS — M059 Rheumatoid arthritis with rheumatoid factor, unspecified: Secondary | ICD-10-CM

## 2023-09-03 DIAGNOSIS — Z8269 Family history of other diseases of the musculoskeletal system and connective tissue: Secondary | ICD-10-CM

## 2023-09-03 DIAGNOSIS — M79671 Pain in right foot: Secondary | ICD-10-CM

## 2023-09-03 DIAGNOSIS — M25561 Pain in right knee: Secondary | ICD-10-CM | POA: Diagnosis not present

## 2023-09-03 DIAGNOSIS — J90 Pleural effusion, not elsewhere classified: Secondary | ICD-10-CM

## 2023-09-03 DIAGNOSIS — R0602 Shortness of breath: Secondary | ICD-10-CM

## 2023-09-03 DIAGNOSIS — M79642 Pain in left hand: Secondary | ICD-10-CM | POA: Diagnosis not present

## 2023-09-03 DIAGNOSIS — M542 Cervicalgia: Secondary | ICD-10-CM | POA: Diagnosis not present

## 2023-09-03 DIAGNOSIS — G8929 Other chronic pain: Secondary | ICD-10-CM | POA: Diagnosis not present

## 2023-09-03 DIAGNOSIS — Z87891 Personal history of nicotine dependence: Secondary | ICD-10-CM

## 2023-09-03 DIAGNOSIS — M79672 Pain in left foot: Secondary | ICD-10-CM

## 2023-09-03 DIAGNOSIS — Z1159 Encounter for screening for other viral diseases: Secondary | ICD-10-CM | POA: Diagnosis not present

## 2023-09-03 DIAGNOSIS — E291 Testicular hypofunction: Secondary | ICD-10-CM

## 2023-09-03 DIAGNOSIS — M25562 Pain in left knee: Secondary | ICD-10-CM

## 2023-09-03 DIAGNOSIS — M79641 Pain in right hand: Secondary | ICD-10-CM

## 2023-09-03 DIAGNOSIS — R5383 Other fatigue: Secondary | ICD-10-CM

## 2023-09-03 DIAGNOSIS — I10 Essential (primary) hypertension: Secondary | ICD-10-CM

## 2023-09-03 DIAGNOSIS — M0579 Rheumatoid arthritis with rheumatoid factor of multiple sites without organ or systems involvement: Secondary | ICD-10-CM | POA: Diagnosis not present

## 2023-09-03 DIAGNOSIS — F1090 Alcohol use, unspecified, uncomplicated: Secondary | ICD-10-CM

## 2023-09-03 DIAGNOSIS — J3089 Other allergic rhinitis: Secondary | ICD-10-CM

## 2023-09-03 DIAGNOSIS — Z840 Family history of diseases of the skin and subcutaneous tissue: Secondary | ICD-10-CM

## 2023-09-03 MED ORDER — PREDNISONE 10 MG PO TABS: ORAL_TABLET | ORAL | 0 refills | Status: DC

## 2023-09-03 NOTE — Telephone Encounter (Signed)
 Pending baseline labs from today, patient will be subcut methotrexate new start  Dose of methotrexate will be 0.8mL (20mg ) once weekly along with folic acid 2mg  daily. Patient specifically wants single-use vials.  Preferred Pharmacy: [facility] on [address] Labs: Please advise patient to repeat CBC/CMP in 2 weeks then every 3 months. Minimize alcohol use, NSAID use, Tylenol use as discussed at OV  Geraldene Kleine, PharmD, MPH, BCPS, CPP Clinical Pharmacist (Rheumatology and Pulmonology)

## 2023-09-03 NOTE — Patient Instructions (Addendum)
 STOP CELEBREX   Rheumatoid Arthritis Rheumatoid arthritis (RA) is a long-term (chronic) disease that causes inflammation in the joints. RA may start slowly. It most often affects the small joints of the hands and feet. Usually, the same joints are affected on both sides of the body. Inflammation from RA can also affect other parts of the body, including the heart, eyes, or lungs. There is no cure for RA, but medicines can help your symptoms and stop or slow down the progression of the disease. What are the causes? RA is an autoimmune disease. When you have an autoimmune disease, your body's defense system (immune system) mistakenly attacks healthy body tissues. The exact cause of RA is not known. What increases the risk? The following factors may make you more likely to develop this condition: Being male. Having a family history of RA or other autoimmune diseases. Having a history of smoking. Being obese. Having been exposed to pollutants or chemicals. What are the signs or symptoms? Symptoms of this condition usually start gradually. They are often worse in the morning. The first symptom may be morning stiffness that lasts longer than 30 minutes. As RA progresses, symptoms may include: Pain, stiffness, swelling, warmth, and tenderness in joints on both sides of your body. Loss of energy. Loss of appetite. Weight loss. Low-grade fever. Dry eyes and dry mouth. Firm lumps (rheumatoid nodules) that grow beneath your skin in areas such as your forearm bones near your elbows and on your hands. Changes in the appearance of joints (deformity) and loss of joint function. Symptoms of this condition vary from person to person. Symptoms of RA often come and go. Sometimes, symptoms get worse for a period of time. These are called flares. How is this diagnosed? This condition is diagnosed based on your symptoms, medical history, and a physical exam. You may have X-rays or an MRI to check for the  type of joint changes that are caused by RA. You may also have blood tests to look for: Proteins (antibodies) that your immune system may make if you have RA. These include rheumatoid factor (RF) and anti-CCP. When blood tests show these proteins, you are said to have seropositive RA. When blood tests do not show these proteins, you may have seronegative RA. Inflammation in your blood. A low number of red blood cells (anemia). How is this treated? The goals of treatment are to relieve pain, reduce inflammation, and slow down or stop joint damage and disability. Treatment may include: Lifestyle changes. It is important to rest as needed, eat a healthy diet, and exercise. Medicines. Your health care provider may adjust your medicines every 3 months until treatment goals are reached. Common medicines include: Pain relievers (analgesics). Corticosteroids and NSAIDs, such as ibuprofen, to reduce inflammation. Disease-modifying antirheumatic drugs (DMARDs) to try to slow the course of the disease. Biologic response modifiers to reduce inflammation and damage. Physical therapy and occupational therapy. Surgery, if you have severe joint damage. Joint replacement or fusing of joints may be needed. Your health care provider will work with you to identify the best treatment option for you based on assessment of the overall disease activity in your body. Follow these instructions at home: Managing pain, stiffness, and swelling If directed, apply heat to the affected area as often as told by your health care provider. Use the heat source that your health care provider recommends, such as a moist heat pack or a heating pad. Place a towel between your skin and the heat source. Leave the  heat on for 20-30 minutes. Remove the heat if your skin turns bright red. This is especially important if you are unable to feel pain, heat, or cold. You have a greater risk of getting burned.  Activity Return to your  normal activities as told by your health care provider. Ask your health care provider what activities are safe for you. Rest when you are having a flare. Start an exercise program as told by your health care provider. This may include physical therapy exercises to maintain movement and strength in your joints. General instructions Take over-the-counter and prescription medicines only as told by your health care provider. Keep all follow-up visits. This is important. Where to find more information [employer]: rheumatology.org Arthritis Foundation: arthritis.org Contact a health care provider if: You have a flare-up of RA symptoms. You have a fever. You have side effects from your medicines. Get help right away if: You have chest pain. You have trouble breathing. You quickly develop a hot, painful joint that is more severe than your usual joint aches. These symptoms may be an emergency. Get help right away. Call 911. Do not wait to see if the symptoms will go away. Do not drive yourself to the hospital. Summary Rheumatoid arthritis (RA) is a long-term (chronic) disease that causes inflammation in the joints. RA is an autoimmune disease. The goals of treatment are to relieve pain, reduce inflammation, and slow down or stop joint damage and disability. This information is not intended to replace advice given to you by your health care provider. Make sure you discuss any questions you have with your health care provider. Document Revised: 01/05/2021 Document Reviewed: 01/05/2021 Elsevier Patient Education  2024 Else[employer] Prednisone Tablets What is this medication? PREDNISONE (PRED ni sone) treats many conditions such as asthma, allergic reactions, arthritis, inflammatory bowel diseases, adrenal, and blood or bone marrow disorders. It works by decreasing inflammation, slowing down an overactive immune system, or replacing cortisol normally made in the body. Cortisol  is a hormone that plays an important role in how the body responds to stress, illness, and injury. It belongs to a group of medications called steroids. This medicine may be used for other purposes; ask your health care provider or pharmacist if you have questions. COMMON BRAND NAME(S): Deltasone, Predone, Sterapred, Sterapred DS What should I tell my care team before I take this medication? They need to know if you have any of these conditions: Cushing's syndrome Diabetes Glaucoma Heart disease High blood pressure Infection (especially a virus infection such as chickenpox, cold sores, or herpes) Kidney disease Liver disease Mental illness Myasthenia gravis Osteoporosis Seizures Stomach or intestine problems Thyroid disease An unusual or allergic reaction to lactose, prednisone, other medications, foods, dyes, or preservatives Pregnant or trying to get pregnant Breast-feeding How should I use this medication? Take this medication by mouth with a glass of water. Follow the directions on the prescription label. Take this medication with food. If you are taking this medication once a day, take it in the morning. Do not take more medication than you are told to take. Do not suddenly stop taking your medication because you may develop a severe reaction. Your care team will tell you how much medication to take. If your care team wants you to stop the medication, the dose may be slowly lowered over time to avoid any side effects. Talk to your care team about the use of this medication in children. Special care may be needed. Overdosage: If you  think you have taken too much of this medicine contact a poison control center or emergency room at once. NOTE: This medicine is only for you. Do not share this medicine with others. What if I miss a dose? If you miss a dose, take it as soon as you can. If it is almost time for your next dose, talk to your care team. You may need to miss a dose or take an  extra dose. Do not take double or extra doses without advice. What may interact with this medication? Do not take this medication with any of the following: Metyrapone Mifepristone This medication may also interact with the following: Aminoglutethimide Amphotericin B Aspirin and aspirin-like medications Barbiturates Certain medications for diabetes, like glipizide or glyburide Cholestyramine Cholinesterase inhibitors Cyclosporine Digoxin Diuretics Ephedrine Male hormones, like estrogens and birth control pills Isoniazid Ketoconazole NSAIDS, medications for pain and inflammation, like ibuprofen or naproxen Phenytoin Rifampin Toxoids Vaccines Warfarin This list may not describe all possible interactions. Give your health care provider a list of all the medicines, herbs, non-prescription drugs, or dietary supplements you use. Also tell them if you smoke, drink alcohol, or use illegal drugs. Some items may interact with your medicine. What should I watch for while using this medication? Visit your care team for regular checks on your progress. If you are taking this medication over a prolonged period, carry an identification card with your name and address, the type and dose of your medication, and your care team's name and address. This medication may increase your risk of getting an infection. Tell your care team if you are around anyone with measles or chickenpox, or if you develop sores or blisters that do not heal properly. If you are going to have surgery, tell your care team that you have taken this medication within the last twelve months. Ask your care team about your diet. You may need to lower the amount of salt you eat. This medication may increase blood sugar. Ask your care team if changes in diet or medications are needed if you have diabetes. What side effects may I notice from receiving this medication? Side effects that you should report to your care team as soon as  possible: Allergic reactions--skin rash, itching, hives, swelling of the face, lips, tongue, or throat Cushing syndrome--increased fat around the midsection, upper back, neck, or face, pink or purple stretch marks on the skin, thinning, fragile skin that easily bruises, unexpected hair growth High blood sugar (hyperglycemia)--increased thirst or amount of urine, unusual weakness or fatigue, blurry vision Increase in blood pressure Infection--fever, chills, cough, sore throat, wounds that don't heal, pain or trouble when passing urine, general feeling of discomfort or being unwell Low adrenal gland function--nausea, vomiting, loss of appetite, unusual weakness or fatigue, dizziness Mood and behavior changes--anxiety, nervousness, confusion, hallucinations, irritability, hostility, thoughts of suicide or self-harm, worsening mood, feelings of depression Stomach bleeding--bloody or black, tar-like stools, vomiting blood or brown material that looks like coffee grounds Swelling of the ankles, hands, or feet Side effects that usually do not require medical attention (report to your care team if they continue or are bothersome): Acne General discomfort and fatigue Headache Increase in appetite Nausea Trouble sleeping Weight gain This list may not describe all possible side effects. Call your doctor for medical advice about side effects. You may report side effects to FDA at 1-800-FDA-1088. Where should I keep my medication? Keep out of the reach of children. Store at room temperature between 15 and 30  degrees C (59 and 86 degrees F). Protect from light. Keep container tightly closed. Throw away any unused medication after the expiration date. NOTE: This sheet is a summary. It may not cover all possible information. If you have questions about this medicine, talk to your doctor, pharmacist, or health care provider.  2024 Elsevier/Gold Standard (2020-06-03 00:00:00) Methotrexate Injection What is  this medication? METHOTREXATE (METH oh TREX ate) treats inflammatory conditions such as arthritis and psoriasis. It works by decreasing inflammation, which can reduce pain and prevent long-term injury to the joints and skin. It may also be used to treat some types of cancer. It works by slowing down the growth of cancer cells. This medicine may be used for other purposes; ask your health care provider or pharmacist if you have questions. What should I tell my care team before I take this medication? They need to know if you have any of these conditions: Fluid in the stomach area or lungs Frequently drink alcohol Infection or immune system problems Kidney disease Liver disease Low blood counts (white cells, platelets, or red blood cells) Lung disease Recent or ongoing radiation Recent or upcoming vaccine Stomach ulcers Ulcerative colitis An unusual or allergic reaction to methotrexate, other medications, foods, dyes, or preservatives Pregnant or trying to get pregnant Breastfeeding How should I use this medication? This medication is for infusion into a vein or for injection into muscle or into the spinal fluid (whichever applies). It is usually given in a hospital or clinic setting. In rare cases, you might get this medication at home. You will be taught how to give this medication. Use exactly as directed. Take your medication at regular intervals. Do not take your medication more often than directed. If this medication is used for arthritis or psoriasis, it should be taken weekly, NOT daily. It is important that you put your used needles and syringes in a special sharps container. Do not put them in a trash can. If you do not have a sharps container, call your pharmacist or care team to get one. Talk to your care team about the use of this medication in children. While this medication may be prescribed for children as young as 2 years for selected conditions, precautions do  apply. Overdosage: If you think you have taken too much of this medicine contact a poison control center or emergency room at once. NOTE: This medicine is only for you. Do not share this medicine with others. What if I miss a dose? It is important not to miss your dose. Call your care team if you are unable to keep an appointment. If you give yourself the medication, and you miss a dose, talk with your care team. Do not take double or extra doses. What may interact with this medication? Do not take this medication with any of the following: Acitretin Probenecid This medication may also interact with the following: Aspirin or aspirin-like medications Azathioprine Certain antibiotics, such as gentamicin, penicillin, tetracycline, vancomycin Certain medications that treat or prevent blood clots, such as warfarin, apixaban, dabigatran, rivaroxaban Certain medications for stomach problems, such as esomeprazole, omeprazole, pantoprazole Dapsone Hydroxychloroquine Live virus vaccines Medications for viral infections, such as acyclovir, cidofovir, foscarnet, ganciclovir Mercaptopurine NSAIDs, medications for pain and inflammation, such as ibuprofen or naproxen Phenytoin Pyrimethamine Retinoids, such as isotretinoin or tretinoin Sulfonamides, such as sulfasalazine or trimethoprim; sulfamethoxazole Theophylline This list may not describe all possible interactions. Give your health care provider a list of all the medicines, herbs, non-prescription drugs, or  dietary supplements you use. Also tell them if you smoke, drink alcohol, or use illegal drugs. Some items may interact with your medicine. What should I watch for while using this medication? This medication may make you feel generally unwell. This is not uncommon as chemotherapy can affect healthy cells as well as cancer cells. Report any side effects. Continue your course of treatment even though you feel ill unless your care team tells you to  stop. Your condition will be monitored carefully while you are receiving this medication. Avoid alcoholic drinks. This medication can cause serious side effects. To reduce the risk, your care team may give you other medications to take before receiving this one. Be sure to follow the directions from your care team. This medication can make you more sensitive to the sun. Keep out of the sun. If you cannot avoid being in the sun, wear protective clothing and use sunscreen. Do not use sun lamps or tanning beds/booths. You may get drowsy or dizzy. Do not drive, use machinery, or do anything that needs mental alertness until you know how this medication affects you. Do not stand or sit up quickly, especially if you are an older patient. This reduces the risk of dizzy or fainting spells. You may need blood work while you are taking this medication. Call your care team for advice if you get a fever, chills or sore throat, or other symptoms of a cold or flu. Do not treat yourself. This medication decreases your body's ability to fight infections. Try to avoid being around people who are sick. This medication may increase your risk to bruise or bleed. Call your care team if you notice any unusual bleeding. Be careful brushing or flossing your teeth or using a toothpick because you may get an infection or bleed more easily. If you have any dental work done, tell your dentist you are receiving this medication Check with your care team if you get an attack of severe diarrhea, nausea and vomiting, or if you sweat a lot. The loss of too much body fluid can make it dangerous for you to take this medication. Talk to your care team about your risk of cancer. You may be more at risk for certain types of cancers if you take this medication. Do not become pregnant while taking this medication or for 6 months after stopping it. Women should inform their care team if they wish to become pregnant or think they might be  pregnant. Men should not father a child while taking this medication and for 3 months after stopping it. There is potential for serious harm to an unborn child. Talk to your care team for more information. Do not breast-feed an infant while taking this medication or for 1 week after stopping it. This medication may make it more difficult to get pregnant or father a child. Talk to your care team if you are concerned about your fertility. What side effects may I notice from receiving this medication? Side effects that you should report to your care team as soon as possible: Allergic reactions--skin rash, itching, hives, swelling of the face, lips, tongue, or throat Blood clot--pain, swelling, or warmth in the leg, shortness of breath, chest pain Dry cough, shortness of breath or trouble breathing Infection--fever, chills, cough, sore throat, wounds that don't heal, pain or trouble when passing urine, general feeling of discomfort or being unwell Kidney injury--decrease in the amount of urine, swelling of the ankles, hands, or feet Liver injury--right upper belly  pain, loss of appetite, nausea, light-colored stool, dark yellow or brown urine, yellowing of the skin or eyes, unusual weakness or fatigue Low red blood cell count--unusual weakness or fatigue, dizziness, headache, trouble breathing Redness, blistering, peeling, or loosening of the skin, including inside the mouth Seizures Unusual bruising or bleeding Side effects that usually do not require medical attention (report to your care team if they continue or are bothersome): Diarrhea Dizziness Hair loss Nausea Pain, redness, or swelling with sores inside the mouth or throat Vomiting This list may not describe all possible side effects. Call your doctor for medical advice about side effects. You may report side effects to FDA at 1-800-FDA-1088. Where should I keep my medication? This medication is given in a hospital or clinic. It will not  be stored at home. NOTE: This sheet is a summary. It may not cover all possible information. If you have questions about this medicine, talk to your doctor, pharmacist, or health care provider.  2024 Elsevier/Gold Standard (2022-08-08 00:00:00) Standing Labs We placed an order today for your standing lab work.   Please have your standing labs drawn in 2 weeks, 2 months and then every 3 months  Please have your labs drawn 2 weeks prior to your appointment so that the provider can discuss your lab results at your appointment, if possible.  Please note that you may see your imaging and lab results in MyChart before we have reviewed them. We will contact you once all results are reviewed. Please allow our office up to 72 hours to thoroughly review all of the results before contacting the office for clarification of your results.  WALK-IN LAB HOURS  Monday through Thursday from 8:00 am -12:30 pm and 1:00 pm-4:00 pm and Friday from 8:00 am-12:00 pm.  Patients with office visits requiring labs will be seen before walk-in labs.  You may encounter longer than normal wait times. Please allow additional time. Wait times may be shorter on  Monday and Thursday afternoons.  We do not book appointments for walk-in labs. We appreciate your patience and understanding with our staff.   Labs are drawn by [facility]. Please bring your co-pay at the time of your lab draw.  You may receive a bill from [facility] for your lab work.  Please note if you are on Hydroxychloroquine and and an order has been placed for a Hydroxychloroquine level,  you will need to have it drawn 4 hours or more after your last dose.  If you wish to have your labs drawn at another location, please call the office 24 hours in advance so we can fax the orders.  The office is located at [address], [place], [place] [address]   If you have any questions regarding directions or hours of operation,  please call [phone].   As a  reminder, please drink plenty of water prior to coming for your lab work. Thanks!   Vaccines You are taking a medication(s) that can suppress your immune system.  The following immunizations are recommended: Flu annually Covid-19  Td/Tdap (tetanus, diphtheria, pertussis) every 10 years Pneumonia (Prevnar 15 then Pneumovax 23 at least 1 year apart.  Alternatively, can take Prevnar 20 without needing additional dose) Shingrix: 2 doses from 4 weeks to 6 months apart  Please check with your PCP to make sure you are up to date.   If you have signs or symptoms of an infection or start antibiotics: First, call your PCP for workup of your infection. Hold  your medication through the infection, until you complete your antibiotics, and until symptoms resolve if you take the following: Injectable medication (Actemra, Benlysta, Cimzia, Cosentyx, Enbrel, Humira, Kevzara, Orencia, Remicade, Simponi, Stelara, Taltz, Tremfya) Methotrexate Leflunomide (Arava) Mycophenolate (Cellcept) Cloria Danger, Olumiant, or Rinvoq

## 2023-09-03 NOTE — Progress Notes (Signed)
 Referral to rheumatology sent.  Acey Ace, MD Advanced Bronch[facility]

## 2023-09-03 NOTE — Progress Notes (Signed)
 Office Visit Note  Patient: Barry Barry             Date of Birth: [birth year]           MRN: [id number]             PCP: Lanae Pinal, MD Referring: Lanae Pinal, MD Visit Date: 09/03/2023 Occupation: @GUAROCC @  Subjective:  Pain in multiple joints and pleural effusion   History of Present Illness: Barry Barry is a 68 y.o. male seen for evaluation of positive rheumatoid factor, pleural effusion and inflammatory arthritis.  Patient states his symptoms started about a month ago with pain in his knee joints.  He states he had some labs which showed positive rheumatoid factor.  He went to his office for work and started developing some shortness of breath.  He states he was seen at the walk-in clinic at [facility].  He was referred to the [facility] emergency room.  He was evaluated on Aug 14, 2023.  He had right lung thoracentesis.  1 L of serosanguineous fluid was aspirated.  He was referred to pulmonologist.  He was also placed on Augmentin  for about 10 days.  Patient states he was evaluated at an orthopedic urgent care at [facility] where he had cortisone injections to bilateral knee joints.  He had some relief for about 3 to 4 weeks.  He was evaluated by Dr. Viva Grise on August 26, 2023.  She reviewed the pleural fluid which had very low glucose and cytology consistent with inflammatory process.  Checks x-ray was negative for pneumonia.  She placed him on Celebrex  to prevent recurrence of fluid.  He had repeat pleural tap on September 02, 2023 and 830 cc of slightly hazy yellow fluid was aspirated.  It was again sent for the analysis.  Patient was referred to me for the evaluation of rheumatoid arthritis.  He states he has been experiencing pain and discomfort in his bilateral wrist joints, left hand and his both knees.  He notices occasional swelling in his right ankle joint.  He has not noticed any rash.  There is no history of iritis.  He is right-handed, works as a IT trainer.  He also likes to  walk and lifts weights.  He was accompanied by his wife Archibald Beard today.  He has 1 son who has psoriasis.  He has a sister who is systemic lupus.  He drinks 6-8 alcoholic beverages per week.  He is former smoker and quit smoking in 1982.    Activities of Daily Living:  Patient reports morning stiffness for 1 hour.   Patient Reports nocturnal pain.  Difficulty dressing/grooming: Denies Difficulty climbing stairs: Denies Difficulty getting out of chair: Denies Difficulty using hands for taps, buttons, cutlery, and/or writing: Denies  Review of Systems  Constitutional:  Negative for fatigue.  HENT:  Negative for mouth sores and mouth dryness.   Eyes:  Positive for dryness.  Respiratory:  Positive for shortness of breath.   Cardiovascular:  Negative for chest pain and palpitations.  Gastrointestinal:  Negative for blood in stool, constipation and diarrhea.  Endocrine: Positive for increased urination.  Genitourinary:  Negative for involuntary urination.  Musculoskeletal:  Positive for joint pain, joint pain, joint swelling, myalgias, muscle weakness, morning stiffness, muscle tenderness and myalgias. Negative for gait problem.  Skin:  Negative for color change, rash, hair loss and sensitivity to sunlight.  Allergic/Immunologic: Negative for susceptible to infections.  Neurological:  Negative for dizziness and headaches.  Hematological:  Negative for swollen  glands.  Psychiatric/Behavioral:  Positive for sleep disturbance. Negative for depressed mood. The patient is not nervous/anxious.     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) 08/27/2023    Past Medical History:  Diagnosis Date   Erectile dysfunction    Hyperlipidemia    Hypertension     Family History  Problem Relation Age of Onset   Lupus Sister    Heart attack Paternal Grandfather     Psoriasis Son        Scalp   Past Surgical History:  Procedure Laterality Date   SKIN SURGERY     Social History   Social History Narrative   Not on file   Immunization History  Administered Date(s) Administered   Covid-19 Iv Non-us  Vaccine (Bibp, Sinopharm) 06/04/2019, 06/25/2019   Fluad Quad(high Dose 65+) 12/01/2021   Influenza, Quadrivalent, Recombinant, Inj, Pf 12/21/2017   Influenza-Unspecified 01/18/2023   Moderna SARS-COV2 Booster Vaccination 01/18/2023   PFIZER(Purple Top)SARS-COV-2 Vaccination 06/04/2019   PNEUMOCOCCAL CONJUGATE-20 12/01/2021   Zoster Recombinant(Shingrix) 10/12/2017, 01/18/2018     Objective: Vital Signs: BP 120/73 (BP Location: Right Arm, Patient Position: Sitting, Cuff Size: Large)   Pulse 80   Resp 14   Ht 6' 0.75 (1.848 m)   Wt 227 lb (103 kg)   BMI 30.16 kg/m    Physical Exam Vitals and nursing note reviewed.  Constitutional:      Appearance: He is well-developed.  HENT:     Head: Normocephalic and atraumatic.   Eyes:     Conjunctiva/sclera: Conjunctivae normal.     Pupils: Pupils are equal, round, and reactive to light.    Cardiovascular:     Rate and Rhythm: Normal rate and regular rhythm.     Heart sounds: Normal heart sounds.  Pulmonary:     Effort: Pulmonary effort is normal.     Breath sounds: Normal breath sounds.     Comments: Diminished breath sounds in the right lung base. Abdominal:     General: Bowel sounds are normal.     Palpations: Abdomen is soft.   Musculoskeletal:     Cervical back: Normal range of motion and neck supple.   Skin:    General: Skin is warm and dry.     Capillary Refill: Capillary refill takes less than 2 seconds.   Neurological:     Mental Status: He is alert and oriented to person, place, and time.   Psychiatric:        Behavior: Behavior normal.      Musculoskeletal Exam: Patient had limited lateral rotation of the cervical spine with some stiffness.  He had thoracic  kyphosis.  He had no tenderness over thoracic or lumbar spine.  Shoulders, elbows were in good range of motion.  He had synovitis over his right wrist joint.  There was no tenderness or swelling over MCPs.  PIPs and DIPs in good range of motion.  Hip joints with good range of motion.  He had warmth and swelling on palpation of bilateral knee joints.  There was no tenderness or swelling over ankles.  There was no tenderness over MTPs.  He had no difficulty getting up from the squatting position.  He had bilateral pes planus.  CDAI Exam: CDAI Score: -- Patient Global: --; Provider Global: -- Swollen: --; Tender: -- Joint Exam 09/03/2023   No joint exam has been  documented for this visit   There is currently no information documented on the homunculus. Go to the Rheumatology activity and complete the homunculus joint exam.  Investigation: No additional findings.  Imaging: US  THORACENTESIS ASP PLEURAL SPACE W/IMG GUIDE Result Date: 09/02/2023 INDICATION: Pleural effusion on right Patient with history of rheumatoid arthritis, recurrent loculated right pleural effusion. Request received for diagnostic and therapeutic right thoracentesis. EXAM: EXAM ULTRASOUND GUIDED DIAGNOSTIC AND THERAPEUTIC RIGHT THORACENTESIS MEDICATIONS: 8 mL 1% lidocaine  COMPLICATIONS: None immediate. PROCEDURE: An ultrasound guided thoracentesis was thoroughly discussed with the patient and questions answered. The benefits, risks, alternatives and complications were also discussed. The patient understands and wishes to proceed with the procedure. Written consent was obtained. Ultrasound was performed to localize and mark an adequate pocket of fluid in the RIGHT chest. The area was then prepped and draped in the normal sterile fashion. 1% Lidocaine  was used for local anesthesia. Under ultrasound guidance a 6 Fr Safe-T-Centesis catheter was introduced. Thoracentesis was performed. The catheter was removed and a dressing applied.  FINDINGS: A total of approximately 830 mL of slightly hazy, yellow fluid was removed. Samples were sent to the laboratory as requested by the clinical team. IMPRESSION: Successful ultrasound guided diagnostic and therapeutic RIGHT thoracentesis yielding 830 mL of pleural fluid. Performed by: Wash Hack Electronically Signed   By: Art Largo M.D.   On: 09/02/2023 15:30   DG Chest 1 View Result Date: 09/02/2023 CLINICAL DATA:  [id number] S/P thoracentesis [id number] EXAM: CHEST  1 VIEW COMPARISON:  IR ultrasound, earlier same day.  Chest XR, 08/26/2023. FINDINGS: Cardiomediastinal silhouette is within normal limits. The LEFT lung remains well inflated. Improved aeration of the RIGHT lung post thoracentesis without residual pleural effusion. No pneumothorax. No focal consolidation or mass. No acute osseous abnormality. IMPRESSION: Improved aeration of the RIGHT lung post thoracentesis without residual pleural effusion. No pneumothorax. Electronically Signed   By: Art Largo M.D.   On: 09/02/2023 15:26   DG Chest 2 View Result Date: 08/30/2023 CLINICAL DATA:  Followup pleural effusion. EXAM: CHEST - 2 VIEW COMPARISON:  08/06/2023. FINDINGS: Cardiac silhouette is normal in size and configuration. Normal mediastinal and hilar contours. Moderate right pleural effusion obscures the right hemidiaphragm. There is associated dependent atelectasis. Mild opacity noted at the left lung base consistent with atelectasis with a trace pleural effusion. Remainder of the lungs is clear.  No pneumothorax. Skeletal structures are unremarkable. IMPRESSION: 1. No acute findings. 2. Moderate right pleural effusion with associated dependent atelectasis similar to the exam from 08/14/2023. Electronically Signed   By: Amanda Jungling M.D.   On: 08/30/2023 09:01   DG Chest Port 1 View Result Date: 08/14/2023 CLINICAL DATA:  Status post thoracentesis, right. EXAM: PORTABLE CHEST 1 VIEW COMPARISON:  Radiograph earlier today FINDINGS:  Decreased right pleural effusion post thoracentesis. No visible pneumothorax. Hazy opacity at the right lung base likely represents a small amount of residual pleural fluid and atelectasis/airspace disease. No pulmonary edema. Stable heart size and mediastinal contours. IMPRESSION: 1. Decreased right pleural effusion post thoracentesis. No visible pneumothorax. 2. Hazy opacity at the right lung base likely represents a small amount of residual pleural fluid and atelectasis/airspace disease. Electronically Signed   By: Chadwick Colonel M.D.   On: 08/14/2023 17:56   CT Angio Chest PE W/Cm &/Or Wo Cm Result Date: 08/14/2023 CLINICAL DATA:  Concern for pulmonary. EXAM: CT ANGIOGRAPHY CHEST WITH CONTRAST TECHNIQUE: Multidetector CT imaging of the chest was performed using the standard protocol during bolus administration of  intravenous contrast. Multiplanar CT image reconstructions and MIPs were obtained to evaluate the vascular anatomy. RADIATION DOSE REDUCTION: This exam was performed according to the departmental dose-optimization program which includes automated exposure control, adjustment of the mA and/or kV according to patient size and/or use of iterative reconstruction technique. CONTRAST:  75mL OMNIPAQUE  IOHEXOL  350 MG/ML SOLN COMPARISON:  Chest radiograph dated 08/06/2023. FINDINGS: Cardiovascular: Mild cardiomegaly. No pericardial effusion. The thoracic aorta is unremarkable. The origins of the great vessels of the aortic arch appear patent. No pulmonary artery embolus identified. Mediastinum/Nodes: No hilar or mediastinal adenopathy. The esophagus is grossly unremarkable. No mediastinal fluid collection. Lungs/Pleura: There is a large right pleural effusion with near complete compressive atelectasis of the right lower lobe. Pneumonia is not excluded. No pneumothorax. The central airways are patent. Upper Abdomen: No acute abnormality. Musculoskeletal: No acute osseous pathology. Review of the MIP images  confirms the above findings. IMPRESSION: 1. No CT evidence of pulmonary artery embolus. 2. Large right pleural effusion with near complete compressive atelectasis of the right lower lobe. Pneumonia is not excluded. Electronically Signed   By: Angus Bark M.D.   On: 08/14/2023 17:29   DG Chest 2 View Result Date: 08/14/2023 CLINICAL DATA:  Shortness of breath. EXAM: CHEST - 2 VIEW COMPARISON:  Radiograph and CT 06/05/2021 FINDINGS: Small to moderate right pleural effusion with fluid tracking into the fissure. Associated right basilar opacities likely atelectasis. No left pleural effusion. The heart is normal in size. Stable mediastinal contours. No pulmonary edema or pneumothorax. On limited assessment, no acute osseous findings. IMPRESSION: Small to moderate right pleural effusion with fluid tracking into the fissure. Associated right basilar opacities likely atelectasis. Electronically Signed   By: Chadwick Colonel M.D.   On: 08/14/2023 16:28    Recent Labs: Lab Results  Component Value Date   WBC 8.7 08/26/2023   HGB 13.6 08/26/2023   PLT 406 (H) 08/26/2023   NA 136 08/14/2023   K 4.3 08/14/2023   CL 101 08/14/2023   CO2 26 08/14/2023   GLUCOSE 99 08/14/2023   BUN 17 08/14/2023   CREATININE 1.03 08/14/2023   BILITOT 0.8 06/05/2021   ALKPHOS 47 06/05/2021   AST 21 06/05/2021   ALT 33 06/05/2021   PROT 6.4 (L) 06/05/2021   ALBUMIN 3.8 06/05/2021   CALCIUM 9.1 08/14/2023   August 26, 2023 RF 78, ENA (double-stranded ENA, RNP, Felipe Horton, SCL 70, SSA, SSB, Marie, Jo 1, centromere) negative September 02, 2023 pleural fluid WBC 3731, total protein 4.7, glucose 26, LDH 685, cytology analysis pending January 17, 2023 LDL 136, PSA 2.89, CMP creatinine 1.11 GFR at 73, AST 17, ALT 20, CBC WBC 5.0, hemoglobin 15.8, platelets 203  Speciality Comments: No specialty comments available.  Procedures:  No procedures performed Allergies: Patient has no known allergies.   Assessment / Plan:     Visit  Diagnoses: Rheumatoid arthritis involving multiple sites with positive rheumatoid factor (HCC) -positive RF 78, inflammatory arthritis, pleural effusion: Patient presents with 1 month history of inflammatory arthritis and pleural effusion sudden onset.  He had thoracentesis x 2.  He was recently evaluated by Dr. Viva Grise and diagnosed with pleural effusion due to rheumatoid arthritis.  He has inflammatory arthritis involving his wrist joints, hands, bilateral knee joints and intermittent swelling in his right ankle.  He also had a stiffness in his neck.  Detailed counseling on rheumatoid arthritis was provided.  Different treatment options and their side effects were discussed at length.  I will start him  on prednisone as a bridging therapy.  Patient is hesitant about prednisone and side effects.  I will start him on a low-dose prednisone at 20 mg p.o. daily and taper by 5 mg every week.  I also plan to initiate methotrexate 0.8 mL subcu weekly along with folic acid 2 mg daily.  Will recheck labs in 2 weeks, 2 months and then every 3 months.  I also had a detailed discussion with the patient that methotrexate may not be sufficient and he may have to add anti-TNF to control his disease.  Side effects of methotrexate were discussed at length.  Patient stated that he will quit drinking alcohol.  Plan: Sedimentation rate, Cyclic citrul peptide antibody, IgG, ANA, Urinalysis, Routine w reflex microscopic.  Information on rheumatoid arthritis, prednisone and methotrexate were placed in the AVS.  High risk medication use -I will obtain following labs to start him on methotrexate.  Plan: Comprehensive metabolic panel with GFR, Hepatitis B core antibody, IgM, Hepatitis B surface antigen, Hepatitis C antibody, QuantiFERON-TB Gold Plus, Serum protein electrophoresis with reflex, IgG, IgA, IgM.  Information reimmunization was placed in the AVS.  He was advised to hold methotrexate if he develops an infection and resume after  the infection resolves.  Pleural effusion on right-he has had recurrent pleural effusion and had thoracentesis x 2.  Shortness of breath-related to pleural effusion.  He had relief after her second pleural tap yesterday.  Pain in both hands -he had tenderness over right wrist joint.  Some thickening was noted over the left second MCP joint.  Plan: XR Hand 2 View Right, XR Hand 2 View Left.  X-rays of bilateral hands showed juxta-articular osteopenia and osteoarthritic changes.  Chronic pain of both knees -he had warmth and swelling of bilateral knee joints.  He had cortisone injection to his bilateral knee joints about 3 weeks ago at [facility] urgent care.  Plan: XR KNEE 3 VIEW RIGHT, XR KNEE 3 VIEW LEFT.  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  Pain in both feet -he gives history of intermittent swelling in his right ankle.  No warmth or swelling was noted on the examination.  He had bilateral pes cavus.  No synovitis was noted over MTP joints.  Plan: XR Foot 2 Views Right, XR Foot 2 Views Left, x-rays with history of osteoarthritis of the foot.  Uric acid  Neck pain -he had limited range of motion of the cervical spine.  Plan: XR Cervical Spine 2 or 3 views.  C5-C6 and C6-C7 narrowing was noted.  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  Other fatigue -he has been experiencing some fatigue since he had pleural effusion.  Plan: CK, TSH  Primary hypertension-blood pressure was 120/73 today.  He is on amlodipine.  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    Alcohol use - Beer/wine 8 servings weekly.Patient states he will quit drinking alcohol to start on methotrexate.  Family history of systemic lupus erythematosus-sister  Family history of psoriasis-son  Orders: Orders Placed This  Encounter  Procedures   XR Hand 2 View Right   XR Hand 2 View Left   XR Cervical Spine 2 or 3 views   XR KNEE 3 VIEW RIGHT   XR KNEE 3 VIEW LEFT   XR Foot 2 Views Right   XR Foot 2 Views Left  Comprehensive metabolic panel with GFR   Sedimentation rate   CK   TSH   Uric acid   Cyclic citrul peptide antibody, IgG   ANA   Hepatitis B core antibody, IgM   Hepatitis B surface antigen   Hepatitis C antibody   QuantiFERON-TB Gold Plus   Serum protein electrophoresis with reflex   IgG, IgA, IgM   Urinalysis, Routine w reflex microscopic   No orders of the defined types were placed in this encounter.   Face-to-face time spent with patient was over 75 minutes. Greater than 50% of time was spent in counseling and coordination of care.  Follow-Up Instructions: Return for Rheumatoid arthritis, pleural effusion.   Nicholas Bari, MD  Note - This record has been created using Animal nutritionist.  Chart creation errors have been sought, but may not always  have been located. Such creation errors do not reflect on  the standard of medical care.

## 2023-09-03 NOTE — Progress Notes (Signed)
 Pharmacy Note  Subjective: Patient presents today to [facility] for follow up office visit. Patient seen by the pharmacist for counseling on methotrexate for rheumatoid arthritis. He is naive to all meds for RA.  Objective: CBC    Component Value Date/Time   WBC 8.7 08/26/2023 1330   RBC 5.26 08/26/2023 1330   HGB 13.6 08/26/2023 1330   HCT 45.0 08/26/2023 1330   PLT 406 (H) 08/26/2023 1330   MCV 85.6 08/26/2023 1330   MCH 25.9 (L) 08/26/2023 1330   MCHC 30.2 08/26/2023 1330   RDW 15.4 08/26/2023 1330   LYMPHSABS 1.3 08/26/2023 1330   MONOABS 0.5 08/26/2023 1330   EOSABS 0.3 08/26/2023 1330   BASOSABS 0.0 08/26/2023 1330    CMP     Component Value Date/Time   NA 136 08/14/2023 1438   K 4.3 08/14/2023 1438   CL 101 08/14/2023 1438   CO2 26 08/14/2023 1438   GLUCOSE 99 08/14/2023 1438   BUN 17 08/14/2023 1438   CREATININE 1.03 08/14/2023 1438   CALCIUM 9.1 08/14/2023 1438   PROT 6.4 (L) 06/05/2021 1645   ALBUMIN 3.8 06/05/2021 1645   AST 21 06/05/2021 1645   ALT 33 06/05/2021 1645   ALKPHOS 47 06/05/2021 1645   BILITOT 0.8 06/05/2021 1645   GFRNONAA >60 08/14/2023 1438    Baseline Immunosuppressant Therapy Labs TB GOLD   Hepatitis Panel   HIV No results found for: HIV Immunoglobulins   SPEP    Latest Ref Rng & Units 06/05/2021    4:45 PM  Serum Protein Electrophoresis  Total Protein 6.5 - 8.1 g/dL 6.4    O9GE No results found for: G6PDH TPMT No results found for: TPMT   Chest-xray:  09/02/23  - Improved aeration of the RIGHT lung post thoracentesis without residual pleural effusion. No pneumothorax.  Contraception: wife is post-menopausal  Alcohol use: will eliminate  Assessment/Plan:   Patient was counseled on the purpose, proper use, and adverse effects of methotrexate including nausea, infection, and signs and symptoms of pneumonitis. Discussed that there is the possibility of an increased risk of malignancy, specifically  lymphomas, but it is not well understood if this increased risk is due to the medication or the disease state.  Instructed patient that medication should be held for infection and prior to surgery.  Advised patient to avoid live vaccines. Recommend annual influenza, Pneumovax 23, Prevnar 13, and Shingrix as indicated.   Reviewed instructions with patient to take methotrexate weekly along with folic acid daily.  Discussed the importance of frequent monitoring of kidney and liver function and blood counts, and provided patient with standing lab instructions.  Counseled patient to avoid NSAIDs and alcohol while on methotrexate.  Provided patient with educational materials on methotrexate and answered all questions.   Patient voiced understanding.  Patient consented to methotrexate use.  Will upload into chart.    Dose of methotrexate will be 0.8mL (20mg ) once weekly along with folic acid 2mg  daily. Patient specifically wants single-use vials. Prescription pending baseline labs from today.   Educated patient on how to use a vial and syringe and reviewed injection technique with patient.  Patient was able to demonstrate proper technique for injections using vial and syringe.  Provided patient educational material regarding injection technique and storage of methotrexate.     Patient was counseled on the purpose, proper use, and adverse effects of prednisone including upset stomach, increased appetite, and trouble sleeping. Encouraged patient to take with breakfast to minimize stomach upset  and prevent insomnia.  Stressed that prednisone is not to be used for long term management due to adverse effects with chronic use such as osteoporosis, skin thinning, increased risk of infection, and increase blood glucose/blood pressure.   Will likely need more aggressive treatment which I advised patient but will wait to run benefits until after f/t appt.  Rx for prednisone sent to pharmacy today  Orders Placed This  Encounter  Procedures   XR Hand 2 View Right   XR Hand 2 View Left   XR Cervical Spine 2 or 3 views   XR KNEE 3 VIEW RIGHT   XR KNEE 3 VIEW LEFT   XR Foot 2 Views Right   XR Foot 2 Views Left   Comprehensive metabolic panel with GFR   Sedimentation rate   CK   TSH   Uric acid   Cyclic citrul peptide antibody, IgG   ANA   Hepatitis B core antibody, IgM   Hepatitis B surface antigen   Hepatitis C antibody   QuantiFERON-TB Gold Plus   Serum protein electrophoresis with reflex   IgG, IgA, IgM   Urinalysis, Routine w reflex microscopic    Geraldene Kleine, PharmD, MPH, BCPS, CPP Clinical Pharmacist (Rheumatology and Pulmonology)

## 2023-09-04 LAB — CYTOLOGY - NON PAP

## 2023-09-05 ENCOUNTER — Ambulatory Visit: Payer: Self-pay | Admitting: Rheumatology

## 2023-09-05 DIAGNOSIS — R778 Other specified abnormalities of plasma proteins: Secondary | ICD-10-CM

## 2023-09-05 NOTE — Progress Notes (Signed)
 Sed rate is elevated, urine shows trace protein, CMP normal, CK normal, TSH normal, anti-CCP negative, hepatitis B and hepatitis C nonreactive, immunoglobulins normal, TB Gold, ANA and IFE pending.  We should be able to send the prescription for methotrexate once the TB Gold results are available.

## 2023-09-06 LAB — PROTEIN ELECTROPHORESIS, SERUM, WITH REFLEX
Albumin ELP: 3.4 g/dL — ABNORMAL LOW (ref 3.8–4.8)
Alpha 1: 0.4 g/dL — ABNORMAL HIGH (ref 0.2–0.3)
Beta 2: 0.3 g/dL (ref 0.2–0.5)
Beta Globulin: 0.5 g/dL (ref 0.4–0.6)
Total Protein: 7 g/dL (ref 6.1–8.1)

## 2023-09-06 LAB — IGG, IGA, IGM: IgM, Serum: 299 mg/dL (ref 50–300)

## 2023-09-06 LAB — QUANTIFERON-TB GOLD PLUS
NIL: 0.02 [IU]/mL
TB1-NIL: <0 [IU]/mL
TB2-NIL: 0 [IU]/mL

## 2023-09-06 LAB — COMPREHENSIVE METABOLIC PANEL WITH GFR
AG Ratio: 1.2 (calc) (ref 1.0–2.5)
AST: 13 U/L (ref 10–35)
CO2: 26 mmol/L (ref 20–32)
Globulin: 3 g/dL (ref 1.9–3.7)
Sodium: 139 mmol/L (ref 135–146)

## 2023-09-06 LAB — ANTI-NUCLEAR AB-TITER (ANA TITER)

## 2023-09-06 LAB — CK: Total CK: 30 U/L (ref 22–308)

## 2023-09-06 MED ORDER — METHOTREXATE SODIUM CHEMO INJECTION (PF) 50 MG/2ML: INTRAMUSCULAR | 2 refills | Status: DC

## 2023-09-06 MED ORDER — FOLIC ACID 1 MG PO TABS: 2.0000 mg | ORAL_TABLET | Freq: Every day | ORAL | 3 refills | Status: AC

## 2023-09-06 MED ORDER — BD TB SYRINGE 27G X 1/2" 1 ML MISC: 12.0000 | 3 refills | Status: AC

## 2023-09-06 NOTE — Progress Notes (Signed)
 TB Gold is negative.  Okay to send prescription for methotrexate as starting at 0.8 mL subcu weekly along with folic acid 2 mg p.o. daily.  Patient will need to labs in 2 weeks, 2 months and then every 3 months.

## 2023-09-07 LAB — URINALYSIS, ROUTINE W REFLEX MICROSCOPIC
Bacteria, UA: NONE SEEN /HPF
Bilirubin Urine: NEGATIVE
Glucose, UA: NEGATIVE
Hgb urine dipstick: NEGATIVE
Hyaline Cast: NONE SEEN /LPF
Leukocytes,Ua: NEGATIVE
Nitrite: NEGATIVE
RBC / HPF: NONE SEEN /HPF (ref 0–2)
Specific Gravity, Urine: 1.034 (ref 1.001–1.035)
Squamous Epithelial / HPF: NONE SEEN /HPF (ref <=5)
WBC, UA: NONE SEEN /HPF (ref 0–5)
pH: 5.5 (ref 5.0–8.0)

## 2023-09-07 LAB — IGG, IGA, IGM
IgG (Immunoglobin G), Serum: 1140 mg/dL (ref 600–1540)
Immunoglobulin A: 142 mg/dL (ref 70–320)

## 2023-09-07 LAB — COMPREHENSIVE METABOLIC PANEL WITH GFR
ALT: 20 U/L (ref 9–46)
Albumin: 3.7 g/dL (ref 3.6–5.1)
Alkaline phosphatase (APISO): 48 U/L (ref 35–144)
BUN: 20 mg/dL (ref 7–25)
Calcium: 9.7 mg/dL (ref 8.6–10.3)
Chloride: 100 mmol/L (ref 98–110)
Creat: 1.15 mg/dL (ref 0.70–1.35)
Glucose, Bld: 88 mg/dL (ref 65–99)
Potassium: 4.9 mmol/L (ref 3.5–5.3)
Total Bilirubin: 0.4 mg/dL (ref 0.2–1.2)
Total Protein: 6.7 g/dL (ref 6.1–8.1)
eGFR: 70 mL/min/{1.73_m2} (ref >=60)

## 2023-09-07 LAB — QUANTIFERON-TB GOLD PLUS
Mitogen-NIL: 8.5 [IU]/mL
QuantiFERON-TB Gold Plus: NEGATIVE

## 2023-09-07 LAB — HEPATITIS B CORE ANTIBODY, IGM: Hep B C IgM: NONREACTIVE

## 2023-09-07 LAB — TSH: TSH: 1.66 m[IU]/L (ref 0.40–4.50)

## 2023-09-07 LAB — HEPATITIS C ANTIBODY: Hepatitis C Ab: NONREACTIVE

## 2023-09-07 LAB — PROTEIN ELECTROPHORESIS, SERUM, WITH REFLEX
Alpha 2: 1.2 g/dL — ABNORMAL HIGH (ref 0.5–0.9)
Gamma Globulin: 1.1 g/dL (ref 0.8–1.7)

## 2023-09-07 LAB — MICROSCOPIC MESSAGE

## 2023-09-07 LAB — URIC ACID: Uric Acid, Serum: 4.4 mg/dL (ref 4.0–8.0)

## 2023-09-07 LAB — IFE INTERPRETATION

## 2023-09-07 LAB — SEDIMENTATION RATE: Sed Rate: 58 mm/h — ABNORMAL HIGH (ref 0–20)

## 2023-09-07 LAB — CYCLIC CITRUL PEPTIDE ANTIBODY, IGG: Cyclic Citrullin Peptide Ab: <16 U

## 2023-09-07 LAB — HEPATITIS B SURFACE ANTIGEN: Hepatitis B Surface Ag: NONREACTIVE

## 2023-09-07 LAB — ANA: Anti Nuclear Antibody (ANA): POSITIVE — AB

## 2023-09-07 LAB — ANTI-NUCLEAR AB-TITER (ANA TITER): ANA Titer 1: 1:40 {titer} — ABNORMAL HIGH

## 2023-09-11 ENCOUNTER — Inpatient Hospital Stay: Admitting: Pulmonary Disease

## 2023-09-11 LAB — MISC LABCORP TEST (SEND OUT): Labcorp test code: 9985

## 2023-09-17 ENCOUNTER — Other Ambulatory Visit: Payer: Self-pay | Admitting: *Deleted

## 2023-09-17 DIAGNOSIS — Z79899 Other long term (current) drug therapy: Secondary | ICD-10-CM

## 2023-09-17 LAB — COMPREHENSIVE METABOLIC PANEL WITH GFR
AG Ratio: 1.4 (calc) (ref 1.0–2.5)
ALT: 24 U/L (ref 9–46)
AST: 19 U/L (ref 10–35)
Albumin: 3.7 g/dL (ref 3.6–5.1)
Alkaline phosphatase (APISO): 37 U/L (ref 35–144)
BUN: 15 mg/dL (ref 7–25)
CO2: 31 mmol/L (ref 20–32)
Calcium: 9.2 mg/dL (ref 8.6–10.3)
Chloride: 101 mmol/L (ref 98–110)
Creat: 1.14 mg/dL (ref 0.70–1.35)
Globulin: 2.6 g/dL (ref 1.9–3.7)
Glucose, Bld: 78 mg/dL (ref 65–99)
Potassium: 4.5 mmol/L (ref 3.5–5.3)
Sodium: 138 mmol/L (ref 135–146)
Total Bilirubin: 0.4 mg/dL (ref 0.2–1.2)
Total Protein: 6.3 g/dL (ref 6.1–8.1)
eGFR: 70 mL/min/{1.73_m2} (ref >=60)

## 2023-09-17 LAB — CBC WITH DIFFERENTIAL/PLATELET
Absolute Lymphocytes: 1556 {cells}/uL (ref 850–3900)
Absolute Monocytes: 422 {cells}/uL (ref 200–950)
Basophils Absolute: 37 {cells}/uL (ref 0–200)
Basophils Relative: 0.6 %
Eosinophils Absolute: 149 {cells}/uL (ref 15–500)
Eosinophils Relative: 2.4 %
HCT: 44.9 % (ref 38.5–50.0)
Hemoglobin: 13.8 g/dL (ref 13.2–17.1)
MCH: 25.8 pg — ABNORMAL LOW (ref 27.0–33.0)
MCHC: 30.7 g/dL — ABNORMAL LOW (ref 32.0–36.0)
MCV: 83.9 fL (ref 80.0–100.0)
MPV: 10.7 fL (ref 7.5–12.5)
Monocytes Relative: 6.8 %
Neutro Abs: 4036 {cells}/uL (ref 1500–7800)
Neutrophils Relative %: 65.1 %
Platelets: 283 10*3/uL (ref 140–400)
RBC: 5.35 10*6/uL (ref 4.20–5.80)
RDW: 15.7 % — ABNORMAL HIGH (ref 11.0–15.0)
Total Lymphocyte: 25.1 %
WBC: 6.2 10*3/uL (ref 3.8–10.8)

## 2023-09-18 ENCOUNTER — Ambulatory Visit: Payer: Self-pay | Admitting: Rheumatology

## 2023-09-18 NOTE — Progress Notes (Signed)
 CBC and CMP are stable.

## 2023-09-18 NOTE — Progress Notes (Signed)
 Office Visit Note  Patient: Barry Barry             Date of Birth: [birth year]           MRN: [id number]             PCP: Regino Slater, MD Referring: Regino Slater, MD Visit Date: 10/01/2023 Occupation: @GUAROCC @  Subjective:  Medication monitoring  History of Present Illness: Barry Barry is a 68 y.o. male with seropositive rheumatoid arthritis and osteoarthritis.  He returns today after his initial visit on September 03, 2023.  He states he did well after starting prednisone  taper.  He also started methotrexate  0.8 mL subcu weekly about 3 weeks ago.  He had labs about a week ago which were normal.  He has been taking folic acid  2 tablets daily.  He had no interruption in the treatment.  He denies any joint pain or joint swelling today.  He was evaluated by Dr. Lanny on September 24, 2023.  She ordered multiple myeloma panel.    Activities of Daily Living:  Patient reports morning stiffness for  none.   Patient Denies nocturnal pain.  Difficulty dressing/grooming: Denies Difficulty climbing stairs: Denies Difficulty getting out of chair: Denies Difficulty using hands for taps, buttons, cutlery, and/or writing: Denies  Review of Systems  Constitutional:  Negative for fatigue.  HENT:  Negative for mouth sores and mouth dryness.   Eyes:  Positive for dryness.  Respiratory:  Negative for shortness of breath.   Cardiovascular:  Negative for chest pain and palpitations.  Gastrointestinal:  Negative for blood in stool, constipation and diarrhea.  Endocrine: Negative for increased urination.  Genitourinary:  Negative for involuntary urination.  Musculoskeletal:  Negative for joint pain, gait problem, joint pain, joint swelling, myalgias, muscle weakness, morning stiffness, muscle tenderness and myalgias.  Skin:  Negative for color change, rash, hair loss and sensitivity to sunlight.  Allergic/Immunologic: Negative for susceptible to infections.  Neurological:  Negative for dizziness and  headaches.  Hematological:  Negative for swollen glands.  Psychiatric/Behavioral:  Positive for sleep disturbance. Negative for depressed mood. The patient is not nervous/anxious.     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) 08/27/2023    Past Medical History:  Diagnosis Date   Erectile dysfunction    Hyperlipidemia    Hypertension     Family History  Problem Relation Age of Onset   Lupus Sister    Heart attack Paternal Grandfather    Psoriasis Son        Scalp   Past Surgical History:  Procedure Laterality Date   SKIN SURGERY     Social History   Social History Narrative   Not on file   Immunization History  Administered Date(s) Administered   Covid-19 Iv Non-us  Vaccine (Bibp, Sinopharm) 06/04/2019, 06/25/2019   Fluad Quad(high Dose 65+) 12/01/2021   Influenza, Quadrivalent, Recombinant, Inj, Pf 12/21/2017   Influenza-Unspecified 01/18/2023   Moderna SARS-COV2 Booster Vaccination 01/18/2023   PFIZER(Purple Top)SARS-COV-2 Vaccination 06/04/2019   PNEUMOCOCCAL CONJUGATE-20 12/01/2021   Zoster Recombinant(Shingrix) 10/12/2017, 01/18/2018     Objective: Vital Signs: BP 125/76 (BP Location: Left Arm, Patient Position: Sitting, Cuff Size: Normal)   Pulse 60   Resp 16   Ht 6' 0.5 (1.842 m)   Wt 220 lb 3.2 oz (99.9 kg)   BMI 29.45 kg/m    Physical Exam  Vitals and nursing note reviewed.  Constitutional:      Appearance: He is well-developed.  HENT:     Head: Normocephalic and atraumatic.  Eyes:     Conjunctiva/sclera: Conjunctivae normal.     Pupils: Pupils are equal, round, and reactive to light.  Cardiovascular:     Rate and Rhythm: Normal rate and regular rhythm.     Heart sounds: Normal heart sounds.  Pulmonary:     Effort: Pulmonary effort is normal.     Breath sounds: Normal breath sounds.  Abdominal:      General: Bowel sounds are normal.     Palpations: Abdomen is soft.  Musculoskeletal:     Cervical back: Normal range of motion and neck supple.  Skin:    General: Skin is warm and dry.     Capillary Refill: Capillary refill takes less than 2 seconds.  Neurological:     Mental Status: He is alert and oriented to person, place, and time.  Psychiatric:        Behavior: Behavior normal.      Musculoskeletal Exam: He had limited lateral rotation of the cervical spine and limited extension of the cervical spine.  Thoracic kyphosis was noted without any tenderness.  Shoulders, elbows were in good range of motion.  He had good range of motion of bilateral wrist joints without any synovitis.  There was no synovitis over MCPs PIPs or DIPs.  Hip joints and knee joints in good range of motion without any warmth swelling or effusion.  There was no tenderness over ankles or MTPs.  CDAI Exam: CDAI Score: -- Patient Global: 0 / 100; Provider Global: 0 / 100 Swollen: --; Tender: -- Joint Exam 10/01/2023   No joint exam has been documented for this visit   There is currently no information documented on the homunculus. Go to the Rheumatology activity and complete the homunculus joint exam.  Investigation: No additional findings.  Imaging: XR Foot 2 Views Left Result Date: 09/03/2023 PIP and DIP narrowing was noted.  No MTP, intertarsal or tibiotalar joint space narrowing was noted.  No erosive changes were noted. 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  XR Foot 2 Views Right Result Date: 09/03/2023 PIP and DIP narrowing was noted.  No MTP, intertarsal or tibiotalar joint space narrowing was noted.  No erosive changes were noted. 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  XR Cervical Spine 2 or 3 views Result Date: 09/03/2023 Multilevel spondylosis with most significant narrowing between C5-C6 and C6-C7 was noted.  Facet joint  arthropathy was noted. 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  XR KNEE 3 VIEW LEFT Result Date: 09/03/2023 Mild medial compartment narrowing was noted.  Medial and intercondylar osteophytes were noted.  Severe patellofemoral narrowing was noted.  No chondrocalcinosis was noted. 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  XR KNEE 3 VIEW RIGHT Result Date: 09/03/2023 Mild medial compartment narrowing was noted.  Medial and intercondylar osteophytes were noted.  Severe patellofemoral narrowing was noted.  No chondrocalcinosis was noted. 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  XR Hand 2 View Left Result Date: 09/03/2023 CMC, PIP and DIP narrowing was noted.  No MCP, intercarpal or radiocarpal joint space narrowing was noted.  Juxta-articular osteopenia was noted.  No erosive changes were noted. 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  XR Hand 2 View Right Result Date: 09/03/2023 CMC, PIP and DIP narrowing was noted.  No MCP, intercarpal or radiocarpal joint space narrowing was noted.  Juxta-articular osteopenia was noted.  No erosive changes were noted. 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  US  THORACENTESIS ASP PLEURAL SPACE W/IMG GUIDE Result Date: 09/02/2023 INDICATION: Pleural effusion on right Patient with history of rheumatoid arthritis, recurrent loculated right pleural effusion. Request received for diagnostic and therapeutic right thoracentesis. EXAM: EXAM ULTRASOUND GUIDED DIAGNOSTIC AND THERAPEUTIC RIGHT THORACENTESIS MEDICATIONS: 8 mL 1% lidocaine  COMPLICATIONS: None immediate. PROCEDURE: An ultrasound guided thoracentesis was thoroughly discussed with the patient and questions answered. The benefits, risks, alternatives and  complications were also discussed. The patient understands and wishes to proceed with the procedure. Written consent was obtained. Ultrasound was performed to localize and mark an adequate pocket of fluid in the RIGHT chest. The area was then prepped and draped in the normal sterile fashion. 1% Lidocaine  was used for local anesthesia. Under ultrasound guidance a 6 Fr Safe-T-Centesis catheter was introduced. Thoracentesis was performed. The catheter was removed and a dressing applied. FINDINGS: A total of approximately 830 mL of slightly hazy, yellow fluid was removed. Samples were sent to the laboratory as requested by the clinical team. IMPRESSION: Successful ultrasound guided diagnostic and therapeutic RIGHT thoracentesis yielding 830 mL of pleural fluid. Performed by: Franky Kelsie RIGGERS Electronically Signed   By: Thom Hall M.D.   On: 09/02/2023 15:30   DG Chest 1 View Result Date: 09/02/2023 CLINICAL DATA:  [id number] S/P thoracentesis [id number] EXAM: CHEST  1 VIEW COMPARISON:  IR ultrasound, earlier same day.  Chest XR, 08/26/2023. FINDINGS: Cardiomediastinal silhouette is within normal limits. The LEFT lung remains well inflated. Improved aeration of the RIGHT lung post thoracentesis without residual pleural effusion. No pneumothorax. No focal consolidation or mass. No acute osseous abnormality. IMPRESSION: Improved aeration of the RIGHT lung post thoracentesis without residual pleural effusion. No pneumothorax. Electronically Signed   By: Thom Hall M.D.   On: 09/02/2023 15:26    Recent Labs: Lab Results  Component Value Date   WBC 6.2 09/17/2023   HGB 13.8 09/17/2023   PLT 283 09/17/2023   NA 138 09/17/2023   K 4.5 09/17/2023   CL 101 09/17/2023   CO2 31 09/17/2023   GLUCOSE 78 09/17/2023   BUN 15 09/17/2023   CREATININE 1.14 09/17/2023   BILITOT 0.4 09/17/2023   ALKPHOS 47 06/05/2021   AST 19 09/17/2023   ALT 24 09/17/2023   PROT 6.3 09/17/2023   ALBUMIN 3.8 06/05/2021   CALCIUM 9.2  09/17/2023   QFTBGOLDPLUS NEGATIVE 09/03/2023     Speciality Comments: No specialty comments available.  Procedures:  No procedures performed Allergies: Patient has no known allergies.   Assessment / Plan:     Visit Diagnoses: Rheumatoid arthritis involving multiple sites with positive rheumatoid factor (HCC) - positive RF 78, inflammatory arthritis, pleural effusion: Patient reports having good response to prednisone  taper and methotrexate .  He has been on methotrexate  0.8 mL subcu for 3 weeks now.  He is also tapering prednisone  and currently taking prednisone  5 mg p.o. daily.  He had no synovitis on the examination today.  He denies any joint inflammation.  He denies any shortness of breath.  He will continue methotrexate  0.8 mL subcu along with folic acid .  We discussed adding anti-TNF's in the future if needed.  High risk medication use - Methotrexate  0.8 mL subcu weekly, folic acid  2 mg p.o. daily,  prednisone  5 mg p.o. daily which she will be finishing over the next 2 days.September 17, 2023 CBC and CMP normal.  Lab results were discussed with the patient and his wife.  He was advised to get repeat labs in 2 months and then every 3 months to monitor for drug toxicity.  Information reimmunization was placed in the AVS.  He was advised to hold methotrexate  if he develops an infection resume until infection resolves.  Pleural effusion on right - recurrent pleural effusion and had thoracentesis x 2.  Patient denies any shortness of breath.  Shortness of breath -  He has an appointment coming up with the pulmonologist in August.  Abnormal SPEP-patient was evaluated by hematology.  He will have a follow-up appointment to discuss the results.  Pain in both hands -he had synovitis in his wrist joint at the initial visit.  No synovitis was noted on the examination today.  X-rays of bilateral hands showed juxta-articular osteopenia and osteoarthritic changes.  X-ray findings were reviewed with the  patient.  A handout on hand exercises was given.  Chronic pain of both knees -he had warmth on palpation of his bilateral knee joints with some swelling at the last visit.  No warmth or swelling was noted.  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  X-ray findings were reviewed with the patient.  A handout on knee exercises was given.  Pain in both feet-he denies any discomfort in his feet today.  He has had intermittent swelling in his feet and ankles in the past.  Neck pain-he continues to have limited range of motion in his cervical spine.  Previous x-rays showed significant disc space narrowing.  A handout on neck exercises was given.  Other fatigue-he continues to have some fatigue.  Other medical problems are listed as follows:  Primary hypertension  Seasonal allergic rhinitis due to other allergic trigger  Hypogonadism in male  Former smoker  Alcohol use-patient stopped drinking alcohol.  Family history of systemic lupus erythematosus-sister  Family history of psoriasis-son  Orders: No orders of the defined types were placed in this encounter.  No orders of the defined types were placed in this encounter.    Follow-Up Instructions: Return in about 3 months (around 01/01/2024) for Rheumatoid arthritis.   Maya Nash, MD  Note - This record has been created using Animal nutritionist.  Chart creation errors have been sought, but may not always  have been located. Such creation errors do not reflect on  the standard of medical care.

## 2023-09-23 NOTE — Progress Notes (Unsigned)
 [facility]   Telephone:([phone]) [phone] Fax:([phone]) [phone]   Clinic New consult Note   Patient Care Team: Regino Slater, MD as PCP - General (Family Medicine) 09/24/2023  CHIEF COMPLAINTS/PURPOSE OF CONSULTATION:  Abnormal SPEP, referred by Dr. Dolphus  HISTORY OF PRESENTING ILLNESS:  Barry Barry 68 y.o. male with PMH including HTN, allergies, hypogonadism (on testosterone and anastrozole), RA with positive RF, pleural effusion is here because of abnormal serum protein electrophoresis.  Recently he developed scattered pains in his knees and wrist, went to [facility] and had rheumatoid arthritis labs drawn.  At the same time he developed acute dyspnea, walk-in clinic sent him to the ED where he was found to have pleural effusion s/p thoracentesis 08/27/2023 Path consistent with inflammatory process.  Sent to rheumatology who confirmed rheumatoid arthritis, found to have abnormal SPEP on baseline labs to start methotrexate .  On 09/03/2023 SPEP shows normal total protein 7.0, low albumin 3.4, elevated alpha-1 0.4, and elevated alpha-2 1.2 otherwise normal beta and gammaglobulins.  IgG, IgA, and IgM all normal. The interpretation showed a poorly defined band of restricted protein mobility detected in the gammaglobulin.  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  CMP is normal, no cytopenias.  No history of thrombosis.  Socially, initially from [place].  He is married with 1 adult son who has psoriasis, a sister with lupus.  Semiretired IT trainer, independent with ADLs and goes to the gym.  Former smoker up to 1 pack/day until his 44s, no alcohol since starting methotrexate  but no heavy use prior to that, no other drug use.  Last colonoscopy 10-12 years ago but does annual Cologuard, up-to-date on PSA.  Today he presents with his wife, feeling well in general.  Since starting methotrexate  and prednisone  his joint pain essentially resolved.  Has not had recurrent  dyspnea although he may feel short of breath when supine but not sure how much is in my head.  Denies cough, chest pain, other large bone pain, fever/chills, unintentional weight loss.    MEDICAL HISTORY:  Past Medical History:  Diagnosis Date   Erectile dysfunction    Hyperlipidemia    Hypertension     SURGICAL HISTORY: Past Surgical History:  Procedure Laterality Date   SKIN SURGERY      SOCIAL HISTORY: Social History   Socioeconomic History   Marital status: Married    Spouse name: Not on file   Number of children: 1   Years of education: Not on file   Highest education level: Not on file  Occupational History   Occupation: IT trainer  Tobacco Use   Smoking status: Former    Current packs/day: 0.00    Types: Cigarettes    Quit date: 08/26/1979    Years since quitting: 44.1    Passive exposure: Past   Smokeless tobacco: Never   Tobacco comments:    Smoked 1 ppd for 44 years 08/26/23  Vaping Use   Vaping status: Never Used  Substance and Sexual Activity   Alcohol use: Not Currently    Alcohol/week: 10.0 standard drinks of alcohol    Types: 10 Standard drinks or equivalent per week    Comment: patient states he plans to cut back significantly   Drug use: Not Currently   Sexual activity: Yes    Partners: Female  Other Topics Concern   Not on file  Social History Narrative   Not on file   Social Drivers of Health   Financial Resource Strain: Not on file  Food Insecurity: No Food Insecurity (09/24/2023)   Hunger Vital Sign    Worried About Running Out of Food in the Last Year: Never true    Ran Out of Food in the Last Year: Never true  Transportation Needs: No Transportation Needs (09/24/2023)   PRAPARE - Administrator, Civil Service (Medical): No    Lack of Transportation (Non-Medical): No  Physical Activity: Not on file  Stress: Not on file  Social Connections: Not on file  Intimate Partner Violence: Not At Risk (09/24/2023)   Humiliation, Afraid,  Rape, and Kick questionnaire    Fear of Current or Ex-Partner: No    Emotionally Abused: No    Physically Abused: No    Sexually Abused: No    FAMILY HISTORY: Family History  Problem Relation Age of Onset   Lupus Sister    Heart attack Paternal Grandfather    Psoriasis Son        Scalp    ALLERGIES:  has no known allergies.  MEDICATIONS:  Current Outpatient Medications  Medication Sig Dispense Refill   amLODipine (NORVASC) 5 MG tablet Take 5 mg by mouth daily.     anastrozole (ARIMIDEX) 1 MG tablet Take 1 mg by mouth daily.     folic acid  (FOLVITE ) 1 MG tablet Take 2 tablets (2 mg total) by mouth daily. 180 tablet 3   KYZATREX 150 MG CAPS Take 2 capsules by mouth 2 (two) times daily.     Loratadine 10 MG CAPS Take 10 mg by mouth daily.     Methotrexate  Sodium (METHOTREXATE , PF,) 50 MG/2ML injection Inject 0.8 mL into skin once weekly. 8 mL 2   predniSONE  (DELTASONE ) 10 MG tablet Take 20mg  po once daily x 7 days, then 15mg  daily x 7 days, then 10mg  daily x 7 days, then 5 mg daily x 7 days 35 tablet 0   tadalafil (CIALIS) 5 MG tablet Take 5 mg by mouth daily.     TUBERCULIN SYR 1CC/27GX1/2 (B-D TB SYRINGE 1CC/27GX1/2) 27G X 1/2 1 ML MISC 12 Syringes by Does not apply route once a week. 12 each 3   TURMERIC PO Take by mouth.     No current facility-administered medications for this visit.    REVIEW OF SYSTEMS:   Constitutional: Denies fatigue, fevers, chills or abnormal night sweats Eyes: Denies blurriness of vision, double vision or watery eyes Ears, nose, mouth, throat, and face: Denies mucositis or sore throat Respiratory: Denies cough, dyspnea or wheezes (+) pleural effusion Cardiovascular: Denies palpitation, chest discomfort or lower extremity swelling Gastrointestinal:  Denies nausea, heartburn or change in bowel habits GU: (+) testosterone supplement Skin: Denies abnormal skin rashes Lymphatics: Denies new lymphadenopathy or easy bruising Neurological:Denies  numbness, tingling or new weaknesses Behavioral/Psych: Mood is stable, no new changes  MSK: (+) RA All other systems were reviewed with the patient and are negative.  PHYSICAL EXAMINATION: ECOG PERFORMANCE STATUS: 0 - Asymptomatic  Vitals:   09/24/23 1149  BP: 116/64  Pulse: (!) 57  Resp: 17  Temp: 98.3 F (36.8 C)  SpO2: 97%   Filed Weights   09/24/23 1149  Weight: 223 lb 12.8 oz (101.5 kg)    GENERAL:alert, no distress and comfortable SKIN:  no rashes or significant lesions EYES: sclera clear NECK: without mass LYMPH:  no palpable cervical or supraclavicular lymphadenopathy  LUNGS: distant R base, clear, with normal breathing effort HEART: regular rate & rhythm, no lower extremity edema ABDOMEN:abdomen soft, non-tender and normal bowel sounds Musculoskeletal:no  cyanosis of digits and no clubbing  PSYCH: alert & oriented x 3 with fluent speech NEURO: no focal motor/sensory deficits  LABORATORY DATA:  I have reviewed the data as listed    Latest Ref Rng & Units 09/17/2023    8:08 AM 08/26/2023    1:30 PM 08/14/2023    2:38 PM  CBC  WBC 3.8 - 10.8 Thousand/uL 6.2  8.7  6.3   Hemoglobin 13.2 - 17.1 g/dL 86.1  86.3  86.3   Hematocrit 38.5 - 50.0 % 44.9  45.0  44.0   Platelets 140 - 400 Thousand/uL 283  406  290        Latest Ref Rng & Units 09/17/2023    8:08 AM 09/03/2023    9:51 AM 08/14/2023    2:38 PM  CMP  Glucose 65 - 99 mg/dL 78  88  99   BUN 7 - 25 mg/dL 15  20  17    Creatinine 0.70 - 1.35 mg/dL 8.85  8.84  8.96   Sodium 135 - 146 mmol/L 138  139  136   Potassium 3.5 - 5.3 mmol/L 4.5  4.9  4.3   Chloride 98 - 110 mmol/L 101  100  101   CO2 20 - 32 mmol/L 31  26  26    Calcium 8.6 - 10.3 mg/dL 9.2  9.7  9.1   Total Protein 6.1 - 8.1 g/dL 6.3  6.7    7.0    Total Bilirubin 0.2 - 1.2 mg/dL 0.4  0.4    AST 10 - 35 U/L 19  13    ALT 9 - 46 U/L 24  20       RADIOGRAPHIC STUDIES: I have personally reviewed the radiological images as listed and agreed with  the findings in the report. XR Foot 2 Views Left Result Date: 09/03/2023 PIP and DIP narrowing was noted.  No MTP, intertarsal or tibiotalar joint space narrowing was noted.  No erosive changes were noted. 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  XR Foot 2 Views Right Result Date: 09/03/2023 PIP and DIP narrowing was noted.  No MTP, intertarsal or tibiotalar joint space narrowing was noted.  No erosive changes were noted. 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  XR Cervical Spine 2 or 3 views Result Date: 09/03/2023 Multilevel spondylosis with most significant narrowing between C5-C6 and C6-C7 was noted.  Facet joint arthropathy was noted. 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  XR KNEE 3 VIEW LEFT Result Date: 09/03/2023 Mild medial compartment narrowing was noted.  Medial and intercondylar osteophytes were noted.  Severe patellofemoral narrowing was noted.  No chondrocalcinosis was noted. 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  XR KNEE 3 VIEW RIGHT Result Date: 09/03/2023 Mild medial compartment narrowing was noted.  Medial and intercondylar osteophytes were noted.  Severe patellofemoral narrowing was noted.  No chondrocalcinosis was noted. 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  XR Hand 2 View Left Result Date: 09/03/2023 CMC, PIP and DIP narrowing was noted.  No MCP, intercarpal or radiocarpal joint space narrowing was noted.  Juxta-articular osteopenia was noted.  No erosive changes were noted. 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  XR Hand 2 View Right Result Date: 09/03/2023 CMC, PIP and DIP narrowing was noted.  No MCP, intercarpal or radiocarpal joint space narrowing was  noted.  Juxta-articular  osteopenia was noted.  No erosive changes were noted. 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  US  THORACENTESIS ASP PLEURAL SPACE W/IMG GUIDE Result Date: 09/02/2023 INDICATION: Pleural effusion on right Patient with history of rheumatoid arthritis, recurrent loculated right pleural effusion. Request received for diagnostic and therapeutic right thoracentesis. EXAM: EXAM ULTRASOUND GUIDED DIAGNOSTIC AND THERAPEUTIC RIGHT THORACENTESIS MEDICATIONS: 8 mL 1% lidocaine  COMPLICATIONS: None immediate. PROCEDURE: An ultrasound guided thoracentesis was thoroughly discussed with the patient and questions answered. The benefits, risks, alternatives and complications were also discussed. The patient understands and wishes to proceed with the procedure. Written consent was obtained. Ultrasound was performed to localize and mark an adequate pocket of fluid in the RIGHT chest. The area was then prepped and draped in the normal sterile fashion. 1% Lidocaine  was used for local anesthesia. Under ultrasound guidance a 6 Fr Safe-T-Centesis catheter was introduced. Thoracentesis was performed. The catheter was removed and a dressing applied. FINDINGS: A total of approximately 830 mL of slightly hazy, yellow fluid was removed. Samples were sent to the laboratory as requested by the clinical team. IMPRESSION: Successful ultrasound guided diagnostic and therapeutic RIGHT thoracentesis yielding 830 mL of pleural fluid. Performed by: Franky Kelsie RIGGERS Electronically Signed   By: Thom Hall M.D.   On: 09/02/2023 15:30   DG Chest 1 View Result Date: 09/02/2023 CLINICAL DATA:  [id number] S/P thoracentesis [id number] EXAM: CHEST  1 VIEW COMPARISON:  IR ultrasound, earlier same day.  Chest XR, 08/26/2023. FINDINGS: Cardiomediastinal silhouette is within normal limits. The LEFT lung remains well inflated. Improved aeration of the RIGHT lung post thoracentesis without residual pleural effusion. No pneumothorax. No  focal consolidation or mass. No acute osseous abnormality. IMPRESSION: Improved aeration of the RIGHT lung post thoracentesis without residual pleural effusion. No pneumothorax. Electronically Signed   By: Thom Hall M.D.   On: 09/02/2023 15:26   DG Chest 2 View Result Date: 08/30/2023 CLINICAL DATA:  Followup pleural effusion. EXAM: CHEST - 2 VIEW COMPARISON:  08/06/2023. FINDINGS: Cardiac silhouette is normal in size and configuration. Normal mediastinal and hilar contours. Moderate right pleural effusion obscures the right hemidiaphragm. There is associated dependent atelectasis. Mild opacity noted at the left lung base consistent with atelectasis with a trace pleural effusion. Remainder of the lungs is clear.  No pneumothorax. Skeletal structures are unremarkable. IMPRESSION: 1. No acute findings. 2. Moderate right pleural effusion with associated dependent atelectasis similar to the exam from 08/14/2023. Electronically Signed   By: Alm Parkins M.D.   On: 08/30/2023 09:01    ASSESSMENT & PLAN: 68 year old male from [place]  Abnormal SPEP -We reviewed his medical record in detail with the patient and family -This was Incidental finding on baseline labs prior to initiating methotrexate  for RA -09/03/2023 SPEP shows normal total protein 7.0, low albumin 3.4, elevated alpha-1 0.4, and elevated alpha-2 1.2 otherwise normal beta and gamma globulins.  IgG, IgA, and IgM all normal. The interpretation showed a poorly defined band of restricted protein mobility in the gammaglobulin.  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  -He has normal CMP, no anemia or crab criteria or h/o thrombosis. This is probably benign/reactive to RA -Will repeat Multiple myeloma panel with SPEP/IFE and light chains. If it shows polyclonal gammopathy, this is likely related to RA; if he is found to have monoclonal/MGUS, will refer him for bone survey and monitor -Discussed indications for bone  marrow biopsy if needed -I will call him  with results of today's labs, then repeat q6 months; f/up in 1 year, or sooner if needed -Pt seen with Dr. Lanny   Rheumatoid arthritis -Presented with joint pain and pleural effusion, s/p thoracentesis - path showed inflammatory process -Started prednisone  and methotrexate , pain resolved quickly.  -No signs of recurrent pleural effusion -Per rheumatology     PLAN: -Medical record reviewed -Lab today, will call with results  -If MGUS, refer for bone survey, lab q6 months and f/up in 1 year -Pt seen with Dr. Lanny  Orders Placed This Encounter  Procedures   Kappa/lambda light chains    Standing Status:   Future    Number of Occurrences:   1    Expiration Date:   09/23/2024   Multiple Myeloma Panel (SPEP&IFE w/QIG)    Standing Status:   Future    Number of Occurrences:   1    Expiration Date:   09/23/2024       All questions were answered. The patient knows to call the clinic with any problems, questions or concerns.      Carmie Lanpher K Belia Febo, NP 09/24/23  Addendum I have seen the patient, examined him. I agree with the assessment and and plan and have edited the notes.   68 year old gentleman with past medical history of hypertension, allergies, hypogonadism, rheumatoid arthritis, was referred for abnormal SPEP.  This was discovered on his lab work by his rheumatologist.  His arthralgia has resolved after methotrexate  and prednisone  for rheumatoid arthritis.  SPEP showed elevated alpha-1 endotube, normal beta globulin and, globulin, immunofixation showed a faint IgM kappa monoclonal immunoglobulin was detected.  His CBC and CMP are unremarkable.  No clinical suspicion for multiple myeloma.  Will repeat multiple myeloma panel, and serum light chain level.  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 Discussed the follow-up plan. Will call him with lab results.  All questions were answered.  Onita Lanny MD 09/24/2023

## 2023-09-24 ENCOUNTER — Inpatient Hospital Stay: Attending: Nurse Practitioner

## 2023-09-24 ENCOUNTER — Inpatient Hospital Stay (HOSPITAL_BASED_OUTPATIENT_CLINIC_OR_DEPARTMENT_OTHER): Admitting: Nurse Practitioner

## 2023-09-24 ENCOUNTER — Encounter: Payer: Self-pay | Admitting: Nurse Practitioner

## 2023-09-24 VITALS — BP 116/64 | HR 57 | Temp 98.3°F | Resp 17 | Wt 223.8 lb

## 2023-09-24 DIAGNOSIS — Z87891 Personal history of nicotine dependence: Secondary | ICD-10-CM | POA: Diagnosis not present

## 2023-09-24 DIAGNOSIS — M069 Rheumatoid arthritis, unspecified: Secondary | ICD-10-CM | POA: Diagnosis not present

## 2023-09-24 DIAGNOSIS — R778 Other specified abnormalities of plasma proteins: Secondary | ICD-10-CM | POA: Insufficient documentation

## 2023-09-25 LAB — KAPPA/LAMBDA LIGHT CHAINS
Kappa free light chain: 21.4 mg/L — ABNORMAL HIGH (ref 3.3–19.4)
Kappa, lambda light chain ratio: 1.47 (ref 0.26–1.65)
Lambda free light chains: 14.6 mg/L (ref 5.7–26.3)

## 2023-09-27 ENCOUNTER — Telehealth: Payer: Self-pay | Admitting: Nurse Practitioner

## 2023-09-27 LAB — MULTIPLE MYELOMA PANEL, SERUM
Albumin SerPl Elph-Mcnc: 3 g/dL (ref 2.9–4.4)
Albumin/Glob SerPl: 1 (ref 0.7–1.7)
Alpha 1: 0.2 g/dL (ref 0.0–0.4)
Alpha2 Glob SerPl Elph-Mcnc: 1 g/dL (ref 0.4–1.0)
B-Globulin SerPl Elph-Mcnc: 0.9 g/dL (ref 0.7–1.3)
Gamma Glob SerPl Elph-Mcnc: 1.1 g/dL (ref 0.4–1.8)
Globulin, Total: 3.2 g/dL (ref 2.2–3.9)
IgA: 113 mg/dL (ref 61–437)
IgG (Immunoglobin G), Serum: 1024 mg/dL (ref 603–1613)
IgM (Immunoglobulin M), Srm: 311 mg/dL — ABNORMAL HIGH (ref 20–172)
Total Protein ELP: 6.2 g/dL (ref 6.0–8.5)

## 2023-09-27 NOTE — Telephone Encounter (Signed)
 Scheduled appointments per 7/8 los. Talked with the patient and he is aware of the made appointments.

## 2023-10-01 ENCOUNTER — Encounter: Payer: Self-pay | Admitting: Rheumatology

## 2023-10-01 ENCOUNTER — Ambulatory Visit: Payer: Self-pay | Admitting: Nurse Practitioner

## 2023-10-01 ENCOUNTER — Ambulatory Visit: Attending: Rheumatology | Admitting: Rheumatology

## 2023-10-01 VITALS — BP 125/76 | HR 60 | Resp 16 | Ht 72.5 in | Wt 220.2 lb

## 2023-10-01 DIAGNOSIS — M25561 Pain in right knee: Secondary | ICD-10-CM

## 2023-10-01 DIAGNOSIS — G8929 Other chronic pain: Secondary | ICD-10-CM

## 2023-10-01 DIAGNOSIS — Z79899 Other long term (current) drug therapy: Secondary | ICD-10-CM

## 2023-10-01 DIAGNOSIS — M79642 Pain in left hand: Secondary | ICD-10-CM

## 2023-10-01 DIAGNOSIS — M0579 Rheumatoid arthritis with rheumatoid factor of multiple sites without organ or systems involvement: Secondary | ICD-10-CM | POA: Diagnosis not present

## 2023-10-01 DIAGNOSIS — R0602 Shortness of breath: Secondary | ICD-10-CM

## 2023-10-01 DIAGNOSIS — J3089 Other allergic rhinitis: Secondary | ICD-10-CM

## 2023-10-01 DIAGNOSIS — Z840 Family history of diseases of the skin and subcutaneous tissue: Secondary | ICD-10-CM

## 2023-10-01 DIAGNOSIS — M25562 Pain in left knee: Secondary | ICD-10-CM

## 2023-10-01 DIAGNOSIS — I10 Essential (primary) hypertension: Secondary | ICD-10-CM

## 2023-10-01 DIAGNOSIS — M542 Cervicalgia: Secondary | ICD-10-CM

## 2023-10-01 DIAGNOSIS — J90 Pleural effusion, not elsewhere classified: Secondary | ICD-10-CM | POA: Diagnosis not present

## 2023-10-01 DIAGNOSIS — M79671 Pain in right foot: Secondary | ICD-10-CM

## 2023-10-01 DIAGNOSIS — M79641 Pain in right hand: Secondary | ICD-10-CM

## 2023-10-01 DIAGNOSIS — R778 Other specified abnormalities of plasma proteins: Secondary | ICD-10-CM

## 2023-10-01 DIAGNOSIS — Z87891 Personal history of nicotine dependence: Secondary | ICD-10-CM

## 2023-10-01 DIAGNOSIS — M79672 Pain in left foot: Secondary | ICD-10-CM

## 2023-10-01 DIAGNOSIS — Z8269 Family history of other diseases of the musculoskeletal system and connective tissue: Secondary | ICD-10-CM

## 2023-10-01 DIAGNOSIS — F1090 Alcohol use, unspecified, uncomplicated: Secondary | ICD-10-CM

## 2023-10-01 DIAGNOSIS — E291 Testicular hypofunction: Secondary | ICD-10-CM

## 2023-10-01 DIAGNOSIS — R5383 Other fatigue: Secondary | ICD-10-CM

## 2023-10-01 NOTE — Patient Instructions (Addendum)
 Standing Labs We placed an order today for your standing lab work.   Please have your standing labs drawn in 2 months and then every 3 months  Please have your labs drawn 2 weeks prior to your appointment so that the provider can discuss your lab results at your appointment, if possible.  Please note that you may see your imaging and lab results in MyChart before we have reviewed them. We will contact you once all results are reviewed. Please allow our office up to 72 hours to thoroughly review all of the results before contacting the office for clarification of your results.  WALK-IN LAB HOURS  Monday through Thursday from 8:00 am -12:30 pm and 1:00 pm-4:30 pm and Friday from 8:00 am-12:00 pm.  Patients with office visits requiring labs will be seen before walk-in labs.  You may encounter longer than normal wait times. Please allow additional time. Wait times may be shorter on  Monday and Thursday afternoons.  We do not book appointments for walk-in labs. We appreciate your patience and understanding with our staff.   Labs are drawn by [facility]. Please bring your co-pay at the time of your lab draw.  You may receive a bill from [facility] for your lab work.  Please note if you are on Hydroxychloroquine and and an order has been placed for a Hydroxychloroquine level,  you will need to have it drawn 4 hours or more after your last dose.  If you wish to have your labs drawn at another location, please call the office 24 hours in advance so we can fax the orders.  The office is located at [address], [address], [place], [place] [address]   If you have any questions regarding directions or hours of operation,  please call [phone].   As a reminder, please drink plenty of water prior to coming for your lab work. Thanks!  Vaccines You are taking a medication(s) that can suppress your immune system.  The following immunizations are recommended: Flu annually Covid-19  Td/Tdap (tetanus,  diphtheria, pertussis) every 10 years Pneumonia (Prevnar 15 then Pneumovax 23 at least 1 year apart.  Alternatively, can take Prevnar 20 without needing additional dose) Shingrix: 2 doses from 4 weeks to 6 months apart  Please check with your PCP to make sure you are up to date.   If you have signs or symptoms of an infection or start antibiotics: First, call your PCP for workup of your infection. Hold your medication through the infection, until you complete your antibiotics, and until symptoms resolve if you take the following: Injectable medication (Actemra, Benlysta, Cimzia, Cosentyx, Enbrel, Humira, Kevzara, Orencia, Remicade, Simponi, Stelara, Taltz, Tremfya) Methotrexate  Leflunomide (Arava) Mycophenolate (Cellcept) Xeljanz, Olumiant, or Rinvoq  Cervical Strain and Sprain Rehab Ask your health care provider which exercises are safe for you. Do exercises exactly as told by your health care provider and adjust them as directed. It is normal to feel mild stretching, pulling, tightness, or discomfort as you do these exercises. Stop right away if you feel sudden pain or your pain gets worse. Do not begin these exercises until told by your health care provider. Stretching and range-of-motion exercises Cervical side bending  Using good posture, sit on a stable chair or stand up. Without moving your shoulders, slowly tilt your left / right ear to your shoulder until you feel a stretch in the neck muscles on the opposite side. You should be looking straight ahead. Hold for __________ seconds. Repeat with the other  side of your neck. Repeat __________ times. Complete this exercise __________ times a day. Cervical rotation  Using good posture, sit on a stable chair or stand up. Slowly turn your head to the side as if you are looking over your left / right shoulder. Keep your eyes level with the ground. Stop when you feel a stretch along the side and the back of your neck. Hold for __________  seconds. Repeat this by turning to your other side. Repeat __________ times. Complete this exercise __________ times a day. Thoracic extension and pectoral stretch  Roll a towel or a small blanket so it is about 4 inches (10 cm) in diameter. Lie down on your back on a firm surface. Put the towel in the middle of your back across your spine. It should not be under your shoulder blades. Put your hands behind your head and let your elbows fall out to your sides. Hold for __________ seconds. Repeat __________ times. Complete this exercise __________ times a day. Strengthening exercises Upper cervical flexion  Lie on your back with a thin pillow behind your head or a small, rolled-up towel under your neck. Gently tuck your chin toward your chest and nod your head down to look toward your feet. Do not lift your head off the pillow. Hold for __________ seconds. Release the tension slowly. Relax your neck muscles completely before you repeat this exercise. Repeat __________ times. Complete this exercise __________ times a day. Cervical extension  Stand about 6 inches (15 cm) away from a wall, with your back facing the wall. Place a soft object, about 6-8 inches (15-20 cm) in diameter, between the back of your head and the wall. A soft object could be a small pillow, a ball, or a folded towel. Gently tilt your head back and press into the soft object. Keep your jaw and forehead relaxed. Hold for __________ seconds. Release the tension slowly. Relax your neck muscles completely before you repeat this exercise. Repeat __________ times. Complete this exercise __________ times a day. Posture and body mechanics Body mechanics refer to the movements and positions of your body while you do your daily activities. Posture is part of body mechanics. Good posture and healthy body mechanics can help to relieve stress in your body's tissues and joints. Good posture means that your spine is in its natural  S-curve position (your spine is neutral), your shoulders are pulled back slightly, and your head is not tipped forward. The following are general guidelines for using improved posture and body mechanics in your everyday activities. Sitting  When sitting, keep your spine neutral and keep your feet flat on the floor. Use a footrest, if needed, and keep your thighs parallel to the floor. Avoid rounding your shoulders. Avoid tilting your head forward. When working at a desk or a computer, keep your desk at a height where your hands are slightly lower than your elbows. Slide your chair under your desk so you are close enough to maintain good posture. When working at a computer, place your monitor at a height where you are looking straight ahead and you do not have to tilt your head forward or downward to look at the screen. Standing  When standing, keep your spine neutral and keep your feet about hip-width apart. Keep a slight bend in your knees. Your ears, shoulders, and hips should line up. When you do a task in which you stand in one place for a long time, place one foot up on a  stable object that is 2-4 inches (5-10 cm) high, such as a footstool. This helps keep your spine neutral. Resting When lying down and resting, avoid positions that are most painful for you. Try to support your neck in a neutral position. You can use a contour pillow or a small rolled-up towel. Your pillow should support your neck but not push on it. This information is not intended to replace advice given to you by your health care provider. Make sure you discuss any questions you have with your health care provider. Document Revised: 07/09/2022 Document Reviewed: 09/25/2021 Elsevier Patient Education  2024 [employer] Hand Exercises Hand exercises can be helpful for almost anyone. They can strengthen your hands and improve flexibility and movement. The exercises can also increase blood flow to the hands. These results can  make your work and daily tasks easier for you. Hand exercises can be especially helpful for people who have joint pain from arthritis or nerve damage from using their hands over and over. These exercises can also help people who injure a hand. Exercises Most of these hand exercises are gentle stretching and motion exercises. It is usually safe to do them often throughout the day. Warming up your hands before exercise may help reduce stiffness. You can do this with gentle massage or by placing your hands in warm water for 10-15 minutes. It is normal to feel some stretching, pulling, tightness, or mild discomfort when you begin new exercises. In time, this will improve. Remember to always be careful and stop right away if you feel sudden, very bad pain or your pain gets worse. You want to get better and be safe. Ask your health care provider which exercises are safe for you. Do exercises exactly as told by your provider and adjust them as told. Do not begin these exercises until told by your provider. Knuckle bend or claw fist  Stand or sit with your arm, hand, and all five fingers pointed straight up. Make sure to keep your wrist straight. Gently bend your fingers down toward your palm until the tips of your fingers are touching your palm. Keep your big knuckle straight and only bend the small knuckles in your fingers. Hold this position for 10 seconds. Straighten your fingers back to your starting position. Repeat this exercise 5-10 times with each hand. Full finger fist  Stand or sit with your arm, hand, and all five fingers pointed straight up. Make sure to keep your wrist straight. Gently bend your fingers into your palm until the tips of your fingers are touching the middle of your palm. Hold this position for 10 seconds. Extend your fingers back to your starting position, stretching every joint fully. Repeat this exercise 5-10 times with each hand. Straight fist  Stand or sit with your  arm, hand, and all five fingers pointed straight up. Make sure to keep your wrist straight. Gently bend your fingers at the big knuckle, where your fingers meet your hand, and at the middle knuckle. Keep the knuckle at the tips of your fingers straight and try to touch the bottom of your palm. Hold this position for 10 seconds. Extend your fingers back to your starting position, stretching every joint fully. Repeat this exercise 5-10 times with each hand. Tabletop  Stand or sit with your arm, hand, and all five fingers pointed straight up. Make sure to keep your wrist straight. Gently bend your fingers at the big knuckle, where your fingers meet your hand, as far down  as you can. Keep the small knuckles in your fingers straight. Think of forming a tabletop with your fingers. Hold this position for 10 seconds. Extend your fingers back to your starting position, stretching every joint fully. Repeat this exercise 5-10 times with each hand. Finger spread  Place your hand flat on a table with your palm facing down. Make sure your wrist stays straight. Spread your fingers and thumb apart from each other as far as you can until you feel a gentle stretch. Hold this position for 10 seconds. Bring your fingers and thumb tight together again. Hold this position for 10 seconds. Repeat this exercise 5-10 times with each hand. Making circles  Stand or sit with your arm, hand, and all five fingers pointed straight up. Make sure to keep your wrist straight. Make a circle by touching the tip of your thumb to the tip of your index finger. Hold for 10 seconds. Then open your hand wide. Repeat this motion with your thumb and each of your fingers. Repeat this exercise 5-10 times with each hand. Thumb motion  Sit with your forearm resting on a table and your wrist straight. Your thumb should be facing up toward the ceiling. Keep your fingers relaxed as you move your thumb. Lift your thumb up as high as you can  toward the ceiling. Hold for 10 seconds. Bend your thumb across your palm as far as you can, reaching the tip of your thumb for the small finger (pinkie) side of your palm. Hold for 10 seconds. Repeat this exercise 5-10 times with each hand. Grip strengthening  Hold a stress ball or other soft ball in the middle of your hand. Slowly increase the pressure, squeezing the ball as much as you can without causing pain. Think of bringing the tips of your fingers into the middle of your palm. All of your finger joints should bend when doing this exercise. Hold your squeeze for 10 seconds, then relax. Repeat this exercise 5-10 times with each hand. Contact a health care provider if: Your hand pain or discomfort gets much worse when you do an exercise. Your hand pain or discomfort does not improve within 2 hours after you exercise. If you have either of these problems, stop doing these exercises right away. Do not do them again unless your provider says that you can. Get help right away if: You develop sudden, severe hand pain or swelling. If this happens, stop doing these exercises right away. Do not do them again unless your provider says that you can. This information is not intended to replace advice given to you by your health care provider. Make sure you discuss any questions you have with your health care provider. Document Revised: 03/20/2022 Document Reviewed: 03/20/2022 Elsevier Patient Education  2024 [employer]  Exercises for Chronic Knee Pain Chronic knee pain is pain that lasts longer than 3 months. For most people with chronic knee pain, exercise and weight loss is an important part of treatment. Your health care provider may want you to focus on: Making the muscles that support your knee stronger. This can take pressure off your knee and reduce pain. Preventing knee stiffness. How far you can move your knee, keeping it there or making it farther. Losing weight (if this applies) to  take pressure off your knee, lower your risk for injury, and make it easier for you to exercise. Your provider will help you make an exercise program that fits your needs and physical abilities. Below are  simple, low-impact exercises you can do at home. Ask your provider or physical therapist how often you should do your exercise program and how many times to repeat each exercise. General safety tips  Get your provider's approval before doing any exercises. Start slowly and stop any time you feel pain. Do not exercise if your knee pain is flaring up. Warm up first. Stretching a cold muscle can cause an injury. Do 5-10 minutes of easy movement or light stretching before beginning your exercises. Do 5-10 minutes of low-impact activity (like walking or cycling) before starting strengthening exercises. Contact your provider any time you have pain during or after exercising. Exercise can cause discomfort but should not be painful. It is normal to be a little stiff or sore after exercising. Stretching and range-of-motion exercises Front thigh stretch  Stand up straight and support your body by holding on to a chair or resting one hand on a wall. With your legs straight and close together, bend one knee to lift your heel up toward your butt. Using one hand for support, grab your ankle with your free hand. Pull your foot up closer toward your butt to feel the stretch in front of your thigh. Hold the stretch for 30 seconds. Repeat __________ times. Complete this exercise __________ times a day. Back thigh stretch  Sit on the floor with your back straight and your legs out straight in front of you. Place the palms of your hands on the floor and slide them toward your feet as you bend at the hip. Try to touch your nose to your knees and feel the stretch in the back of your thighs. Hold for 30 seconds. Repeat __________ times. Complete this exercise __________ times a day. Calf stretch  Stand facing  a wall. Place the palms of your hands flat against the wall, arms extended, and lean slightly against the wall. Get into a lunge position with one leg bent at the knee and the other leg stretched out straight behind you. Keep both feet facing the wall and increase the bend in your knee while keeping the heel of the other leg flat on the ground. You should feel the stretch in your calf. Hold for 30 seconds. Repeat __________ times. Complete this exercise __________ times a day. Strengthening exercises Straight leg lift  Lie on your back with one knee bent and the other leg out straight. Slowly lift the straight leg without bending the knee. Lift until your foot is about 12 inches (30 cm) off the floor. Hold for 3-5 seconds and slowly lower your leg. Repeat __________ times. Complete this exercise __________ times a day. Single leg dip  Stand between two chairs and put both hands on the backs of the chairs for support. Extend one leg out straight with your body weight resting on the heel of the standing leg. Slowly bend your standing knee to dip your body to the level that is comfortable for you. Hold for 3-5 seconds. Repeat __________ times. Complete this exercise __________ times a day. Hamstring curls  Stand straight, knees close together, facing the back of a chair. Hold on to the back of a chair with both hands. Keep one leg straight. Bend the other knee while bringing the heel up toward the butt until the knee is bent at a 90-degree angle (right angle). Hold for 3-5 seconds. Repeat __________ times. Complete this exercise __________ times a day. Wall squat  Stand straight with your back, hips, and head against a wall.  Step forward one foot at a time with your back still against the wall. Your feet should be 2 feet (61 cm) from the wall at shoulder width. Keeping your back, hips, and head against the wall, slide down the wall to as close to a sitting position as you can get. Hold  for 5-10 seconds, then slowly slide back up. Repeat __________ times. Complete this exercise __________ times a day. Step-ups  Stand in front of a sturdy platform or stool that is about 6 inches (15 cm) high. Slowly step up with your left / right foot, keeping your knee in line with your hip and foot. Do not let your knee bend so far that you cannot see your toes. Hold on to a chair for balance, but do not use it for support. Slowly unlock your knee and lower yourself to the starting position. Repeat __________ times. Complete this exercise __________ times a day. Contact a health care provider if: Your exercises cause pain. Your pain is worse after you exercise. Your pain prevents you from doing your exercises. This information is not intended to replace advice given to you by your health care provider. Make sure you discuss any questions you have with your health care provider. Document Revised: 03/20/2022 Document Reviewed: 03/20/2022 Elsevier Patient Education  2024 [employer].

## 2023-10-22 NOTE — Progress Notes (Signed)
 Thank you :)

## 2023-10-29 DIAGNOSIS — E291 Testicular hypofunction: Secondary | ICD-10-CM | POA: Diagnosis not present

## 2023-10-30 ENCOUNTER — Ambulatory Visit

## 2023-10-30 ENCOUNTER — Encounter: Payer: Self-pay | Admitting: Pulmonary Disease

## 2023-10-30 ENCOUNTER — Ambulatory Visit (INDEPENDENT_AMBULATORY_CARE_PROVIDER_SITE_OTHER): Admitting: Pulmonary Disease

## 2023-10-30 VITALS — BP 120/74 | HR 60 | Temp 97.1°F | Ht 72.0 in | Wt 221.6 lb

## 2023-10-30 DIAGNOSIS — J811 Chronic pulmonary edema: Secondary | ICD-10-CM | POA: Diagnosis not present

## 2023-10-30 DIAGNOSIS — J9811 Atelectasis: Secondary | ICD-10-CM | POA: Diagnosis not present

## 2023-10-30 DIAGNOSIS — R0602 Shortness of breath: Secondary | ICD-10-CM | POA: Diagnosis not present

## 2023-10-30 DIAGNOSIS — J90 Pleural effusion, not elsewhere classified: Secondary | ICD-10-CM

## 2023-10-30 DIAGNOSIS — M059 Rheumatoid arthritis with rheumatoid factor, unspecified: Secondary | ICD-10-CM

## 2023-10-30 NOTE — Progress Notes (Unsigned)
 Subjective:    Patient ID: Barry Barry, male    DOB: 02/17/1956, 68 y.o.   MRN: [id number]  Patient Care Team: Regino Slater, MD as PCP - General (Family Medicine) Tamea Dedra CROME, MD as Consulting Physician (Pulmonary Disease) Dolphus Reiter, MD as Consulting Physician (Rheumatology)  Chief Complaint  Patient presents with   Follow-up    DOE. No wheezing. Occasional cough, dry.    BACKGROUND/INTERVAL:Patient is a 68 year old former smoker who presents for follow-up of a right pleural effusion.  Patient was seen in the emergency room on 14 Aug 2023 and had thoracentesis done at that time.  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  Patient has been diagnosed with rheumatoid arthritis.  He was first evaluated here on 26 August 2023.  HPI Discussed the use of AI scribe software for clinical note transcription with the patient, who gave verbal consent to proceed.  History of Present Illness   Barry Barry is a 67 year old male with rheumatoid arthritis who presents for follow-up on a right pleural effusion.  His rheumatoid symptoms, particularly pain in his wrists and knees, have significantly improved with methotrexate , and these symptoms are almost nonexistent now. However, he mentions a sensation of his hands starting to stiffen up, though he is unsure if this is real or imagined.  Regarding his respiratory symptoms, he describes occasional heavy breathing when walking up steeper hills, which he attributes to either aging or reduced lung capacity. No significant coughing, though he sometimes coughs up phlegm in the morning after showering. He recalls experiencing constricted breathing when he first went to the emergency department, but he has not had this symptom again.  He was initially treated with prednisone  for thirty days, which was then discontinued as methotrexate  was introduced.  He is followed by Dr. Dolphus for his rheumatoid arthritis.  He did  not travel to [place] as he planned due to these issues.     Review of Systems A 10 point review of systems was performed and it is as noted above otherwise negative.   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   anastrozole (ARIMIDEX) 1 MG tablet Take 1 mg by mouth daily.   folic acid  (FOLVITE ) 1 MG tablet Take 2 tablets (2 mg total) by mouth daily.   KYZATREX 150 MG CAPS Take 2 capsules by mouth 2 (two) times daily.   Loratadine 10 MG CAPS Take 10 mg by mouth daily.   Methotrexate  Sodium (METHOTREXATE , PF,) 50 MG/2ML injection Inject 0.8 mL into skin once weekly.   tadalafil (CIALIS) 5 MG tablet Take 5 mg by mouth daily.   TUBERCULIN SYR 1CC/27GX1/2 (B-D TB SYRINGE 1CC/27GX1/2) 27G X 1/2 1 ML MISC 12 Syringes by Does not apply route once a week.  TURMERIC PO Take by mouth.   UNABLE TO FIND NUTRITEARS    Immunization History  Administered Date(s) Administered   Covid-19 Iv Non-us  Vaccine (Bibp, Sinopharm) 06/04/2019, 06/25/2019   Fluad Quad(high Dose 65+) 12/01/2021   Influenza, Quadrivalent, Recombinant, Inj, Pf 12/21/2017   Influenza-Unspecified 01/18/2023   Moderna SARS-COV2 Booster Vaccination 01/18/2023   PFIZER(Purple Top)SARS-COV-2 Vaccination 06/04/2019   PNEUMOCOCCAL CONJUGATE-20 12/01/2021   Zoster Recombinant(Shingrix) 10/12/2017, 01/18/2018        Objective:      BP 120/74 (BP Location: Right Arm, Cuff Size: Large)   Pulse 60   Temp (!) 97.1 F (36.2 C)   Ht 6' (1.829 m)   Wt 221 lb 9.6 oz (100.5 kg)   SpO2 95%   BMI 30.05 kg/m   SpO2: 95 % O2 Device: None (Room air)  GENERAL: Well-developed, well-nourished gentleman, no acute distress.  No conversational dyspnea, fully ambulatory. HEAD: Normocephalic, atraumatic.  EYES: Pupils equal, round, reactive to light.  No scleral icterus.  MOUTH: Oral mucosa moist. NECK: Supple. No thyromegaly. Trachea midline. No JVD.  No adenopathy. PULMONARY: Good air entry bilaterally, symmetrical.  No adventitious sounds otherwise. CARDIOVASCULAR: S1 and S2. Regular rate and rhythm.  No rubs, murmurs or gallops heard. ABDOMEN: Benign. MUSCULOSKELETAL: Tenderness and mild synovitis on wrists bilaterally, no clubbing, no edema.  Both knees very warm to touch with effusion evident right greater than left.  No redness. NEUROLOGIC: No overt focal deficit, no gait disturbance, speech is fluent. SKIN: Intact,warm,dry. PSYCH: Mood and behavior normal  POCUS: Office-based ultrasound performed on the right base.  There is minimal loculation of fluid remaining.      Assessment & Plan:     ICD-10-CM   1. Pleural effusion on right  J90 DG Chest 2 View    2. Rheumatoid arthritis with positive rheumatoid factor, involving unspecified site (HCC)  M05.9 DG Chest 2 View    Pulmonary function test    3. Shortness of breath  R06.02 Pulmonary function test      Orders Placed This Encounter  Procedures   DG Chest 2 View    Standing Status:   Future    Number of Occurrences:   1    Expected Date:   10/30/2023    Expiration Date:   10/29/2024    Scheduling Instructions:     [employer].    Reason for Exam (SYMPTOM  OR DIAGNOSIS REQUIRED):   Pleural effusion on Right - follow up    Preferred imaging location?:   [address]   Pulmonary function test    Standing Status:   Future    Expiration  Date:   10/29/2024    Scheduling Instructions:     [address]    Where should this test be performed?:   Outpatient Pulmonary    What type of PFT is being ordered?:   Full PFT    Discussion:    Right pleural effusion secondary to rheumatoid arthritis Right pleural effusion secondary to rheumatoid arthritis is improving. He reports no significant respiratory symptoms such as shortness of breath, and only experiences mild dyspnea on exertion, particularly on steeper hills. Ultrasound shows minimal residual pleural effusion with only a tiny pocket of fluid remaining. Methotrexate  is likely contributing to the improvement by controlling the underlying rheumatoid arthritis. There is a possibility of residual scarring on the pleural surface due to previous inflammation, but it is not expected to significantly impact lung function. - Order chest x-ray to assess current status of  pleural effusion. - Order pulmonary function tests to establish a baseline for lung function. - Schedule follow-up in three months.  Rheumatoid arthritis This issue has complexity does management.  Rheumatoid arthritis is well-controlled with methotrexate . He reports significant improvement in joint pain, particularly in the wrists and knees, with minimal stiffness in the hands. The initial use of prednisone  was to bridge the time until methotrexate  became effective. He is aware of the systemic nature of rheumatoid disease and its potential impact on lung function. - Continue methotrexate  for rheumatoid arthritis management.  - Continue follow-up with Dr. Dolphus     Advised if symptoms do not improve or worsen, to please contact office for sooner follow up or seek emergency care.    I spent 40 minutes of dedicated to the care of this patient on the date of this encounter to include pre-visit review of records, face-to-face time with the patient discussing conditions above, post visit ordering of testing, clinical  documentation with the electronic health record, making appropriate referrals as documented, and communicating necessary findings to members of the patients care team.     C. Leita Sanders, MD Advanced Bronchoscopy PCCM [facility] [facility]    *This note was generated using voice recognition software/Dragon and/or AI transcription program.  Despite best efforts to proofread, errors can occur which can change the meaning. Any transcriptional errors that result from this process are unintentional and may not be fully corrected at the time of dictation.

## 2023-10-30 NOTE — Patient Instructions (Signed)
 VISIT SUMMARY:  Today, we discussed your follow-up on the right pleural effusion and your rheumatoid arthritis management. Your rheumatoid symptoms have significantly improved with methotrexate , and you are experiencing minimal respiratory symptoms related to the pleural effusion.  YOUR PLAN:  -RIGHT PLEURAL EFFUSION SECONDARY TO RHEUMATOID ARTHRITIS: A pleural effusion is a buildup of fluid between the tissues that line the lungs and the chest. Your pleural effusion is improving, with only a tiny pocket of fluid remaining. We will continue to monitor this with a chest x-ray and pulmonary function tests to ensure your lung function is not significantly impacted. Please continue with your current medications, and we will reassess in three months.  -RHEUMATOID ARTHRITIS: Rheumatoid arthritis is an autoimmune disease that causes joint inflammation and pain. Your condition is well-controlled with methotrexate , and you have reported significant improvement in your joint pain. Please continue taking methotrexate  as prescribed to manage your symptoms.  INSTRUCTIONS:  Please get a chest x-ray and pulmonary function tests as ordered. Schedule a follow-up appointment in three months to reassess your condition and perform the pulmonary function tests.

## 2023-11-01 ENCOUNTER — Other Ambulatory Visit: Payer: Self-pay | Admitting: Rheumatology

## 2023-11-01 ENCOUNTER — Ambulatory Visit: Payer: Self-pay | Admitting: Pulmonary Disease

## 2023-11-01 DIAGNOSIS — Z79899 Other long term (current) drug therapy: Secondary | ICD-10-CM

## 2023-11-01 DIAGNOSIS — M0579 Rheumatoid arthritis with rheumatoid factor of multiple sites without organ or systems involvement: Secondary | ICD-10-CM

## 2023-11-04 ENCOUNTER — Encounter: Payer: Self-pay | Admitting: Pulmonary Disease

## 2023-11-06 ENCOUNTER — Telehealth: Payer: Self-pay | Admitting: *Deleted

## 2023-11-06 DIAGNOSIS — R351 Nocturia: Secondary | ICD-10-CM | POA: Diagnosis not present

## 2023-11-06 DIAGNOSIS — N5201 Erectile dysfunction due to arterial insufficiency: Secondary | ICD-10-CM | POA: Diagnosis not present

## 2023-11-06 DIAGNOSIS — E291 Testicular hypofunction: Secondary | ICD-10-CM | POA: Diagnosis not present

## 2023-11-06 DIAGNOSIS — N401 Enlarged prostate with lower urinary tract symptoms: Secondary | ICD-10-CM | POA: Diagnosis not present

## 2023-11-06 NOTE — Telephone Encounter (Signed)
 Patient contacted the office and left message requesting a call back.  Returned call to the patient. Patient states he has been on MTX for 6-8 weeks. Patient states for the last 2-3 weeks he has been having 2-3 episodes of diarrhea daily. Patient states he is not having any other symptoms and this only started after starting the MTX. Patient states he is on the SQ MTX. Patient states the MTX is working well for his RA. Patient states he would be okay to add a medication temporarily for his stomach. Patient states he would also be willing to change medication if necessary. Please advise.

## 2023-11-06 NOTE — Telephone Encounter (Signed)
 I returned call and spoke with Barry Barry.  He states he has been having diarrhea for at least 3 to 4 weeks now.  He reports 2-3 liquidy stool without any blood.  I discussed with the patient that it is unlikely to see diarrhea with methotrexate .  I also reviewed his chart.  He was treated with Augmentin  few months back.  Possibility of C. difficile infection was discussed.  I advised him to schedule an appointment with his PCP for the evaluation of diarrhea.  Stool analysis was advised.  I advised him to hold off methotrexate  until he gets his stool studies back.

## 2023-11-08 DIAGNOSIS — R197 Diarrhea, unspecified: Secondary | ICD-10-CM | POA: Diagnosis not present

## 2023-11-16 ENCOUNTER — Other Ambulatory Visit: Payer: Self-pay | Admitting: Rheumatology

## 2023-11-19 DIAGNOSIS — R197 Diarrhea, unspecified: Secondary | ICD-10-CM | POA: Diagnosis not present

## 2023-11-19 NOTE — Telephone Encounter (Signed)
 Last Fill: 09/06/2023  Labs: 09/17/2023 CBC and CMP are stable.   Next Visit: 01/02/2024  Last Visit: 10/01/2023  DX: Rheumatoid arthritis involving multiple sites with positive rheumatoid factor   Current Dose per office note 10/01/2023: Methotrexate  0.8 mL subcu weekly,   Okay to refill Methotrexate ?

## 2023-11-20 DIAGNOSIS — R197 Diarrhea, unspecified: Secondary | ICD-10-CM | POA: Diagnosis not present

## 2023-11-20 DIAGNOSIS — K529 Noninfective gastroenteritis and colitis, unspecified: Secondary | ICD-10-CM | POA: Diagnosis not present

## 2023-11-21 DIAGNOSIS — R197 Diarrhea, unspecified: Secondary | ICD-10-CM | POA: Diagnosis not present

## 2023-11-21 DIAGNOSIS — K529 Noninfective gastroenteritis and colitis, unspecified: Secondary | ICD-10-CM | POA: Diagnosis not present

## 2023-11-23 LAB — LAB REPORT - SCANNED: EGFR: 70

## 2023-11-25 DIAGNOSIS — R197 Diarrhea, unspecified: Secondary | ICD-10-CM | POA: Diagnosis not present

## 2023-11-27 ENCOUNTER — Telehealth: Payer: Self-pay

## 2023-11-27 NOTE — Telephone Encounter (Signed)
 Labs received from: [place]  [place]ra[place] on: 11/19/2023 & 11/21/2023  Reviewed by: Dr. Dolphus  Labs drawn:   GI path panel, PCR, stool  Pancreatic Elastase, Fecal  TSH  Calprotectin, Fecal  CBC w/diff  CMP  CRP, quant   ESR  C diff toxin gene NAA  Culture, stool    Results: Norovirus GI/GII detected  Calprotectin fecal: 263 C-reactive protein, quant: 22 ESR: 71    Spoke with the patient and all diarrhea symptoms have resolved and he will resume methotrexate .   Copy of labs/GI office notes sent to the scan center.

## 2023-12-02 ENCOUNTER — Telehealth: Payer: Self-pay | Admitting: Rheumatology

## 2023-12-02 ENCOUNTER — Other Ambulatory Visit: Payer: Self-pay

## 2023-12-02 DIAGNOSIS — Z79899 Other long term (current) drug therapy: Secondary | ICD-10-CM

## 2023-12-02 NOTE — Telephone Encounter (Signed)
 I received a phone call from Dr. Regino.  Dr. Regino stated that patient's symptoms of diarrhea have resolved and the patient wants to resume methotrexate .  He stated that it is uncertain if the diarrhea was due to methotrexate  or norovirus.  His stool test came positive for norovirus.  He also saw his gastroenterologist who recommended probiotics.  Patient is on probiotics currently. I advised that patient may resume methotrexate  0.4 mL subcu weekly and if tolerated he can increase the dose to 0.8 mL subcu weekly the following week.  If he has recurrence of diarrhea then we will stop methotrexate  and may have to discuss other treatment options. I also called patient but did not get a response.  I left a message on his answering machine to call back.  Maya Nash, MD

## 2023-12-02 NOTE — Telephone Encounter (Signed)
 Spoke with patient and advised him that Dr. Regino called Dr. Dolphus. Advised patient that per Dr. Dolphus, patient may resume methotrexate  0.4 mL subcu weekly and if tolerated he can increase the dose to 0.8 mL subcu weekly the following week. If he has recurrence of diarrhea then we will stop methotrexate  and may have to discuss other treatment options.   Patient states he resumed methotrexate  last Thursday (11/28/2023) and has been symptom free, no signs of diarrhea. Patient will contact the office if he develops diarrhea or other symptoms.

## 2023-12-03 ENCOUNTER — Ambulatory Visit: Payer: Self-pay | Admitting: Rheumatology

## 2023-12-03 LAB — CBC WITH DIFFERENTIAL/PLATELET
Absolute Lymphocytes: 1096 {cells}/uL (ref 850–3900)
Absolute Monocytes: 391 {cells}/uL (ref 200–950)
Basophils Absolute: 38 {cells}/uL (ref 0–200)
Basophils Relative: 0.6 %
Eosinophils Absolute: 101 {cells}/uL (ref 15–500)
Eosinophils Relative: 1.6 %
HCT: 43.6 % (ref 38.5–50.0)
Hemoglobin: 13.7 g/dL (ref 13.2–17.1)
MCH: 27.2 pg (ref 27.0–33.0)
MCHC: 31.4 g/dL — ABNORMAL LOW (ref 32.0–36.0)
MCV: 86.7 fL (ref 80.0–100.0)
MPV: 10.8 fL (ref 7.5–12.5)
Monocytes Relative: 6.2 %
Neutro Abs: 4675 {cells}/uL (ref 1500–7800)
Neutrophils Relative %: 74.2 %
Platelets: 269 Thousand/uL (ref 140–400)
RBC: 5.03 Million/uL (ref 4.20–5.80)
RDW: 16.1 % — ABNORMAL HIGH (ref 11.0–15.0)
Total Lymphocyte: 17.4 %
WBC: 6.3 Thousand/uL (ref 3.8–10.8)

## 2023-12-03 LAB — COMPREHENSIVE METABOLIC PANEL WITH GFR
AG Ratio: 1.5 (calc) (ref 1.0–2.5)
ALT: 16 U/L (ref 9–46)
AST: 18 U/L (ref 10–35)
Albumin: 4 g/dL (ref 3.6–5.1)
Alkaline phosphatase (APISO): 42 U/L (ref 35–144)
BUN: 17 mg/dL (ref 7–25)
CO2: 31 mmol/L (ref 20–32)
Calcium: 9.4 mg/dL (ref 8.6–10.3)
Chloride: 102 mmol/L (ref 98–110)
Creat: 1.19 mg/dL (ref 0.70–1.35)
Globulin: 2.6 g/dL (ref 1.9–3.7)
Glucose, Bld: 86 mg/dL (ref 65–99)
Potassium: 4.8 mmol/L (ref 3.5–5.3)
Sodium: 139 mmol/L (ref 135–146)
Total Bilirubin: 0.4 mg/dL (ref 0.2–1.2)
Total Protein: 6.6 g/dL (ref 6.1–8.1)
eGFR: 67 mL/min/1.73m2 (ref >=60)

## 2023-12-03 NOTE — Progress Notes (Signed)
 CBC and CMP are stable.

## 2023-12-04 DIAGNOSIS — Z125 Encounter for screening for malignant neoplasm of prostate: Secondary | ICD-10-CM | POA: Diagnosis not present

## 2023-12-04 DIAGNOSIS — E291 Testicular hypofunction: Secondary | ICD-10-CM | POA: Diagnosis not present

## 2023-12-20 NOTE — Progress Notes (Signed)
 Office Visit Note  Patient: Barry Barry             Date of Birth: [birth year]           MRN: [id number]             PCP: Regino Slater, MD Referring: Regino Slater, MD Visit Date: 01/02/2024 Occupation: Data Unavailable  Subjective:  Medication monitoring  History of Present Illness: Gar Glance is a 68 y.o. male with history of seropositive rheumatoid arthritis.  Patient was initiated on injectable methotrexate  at the end of June 2025.  He has been injecting methotrexate  0.8 mL sq injections once weekly and taking folic acid  2 mg daily but has had several gaps in therapy.  According to the patient he had the norovirus which took him about a month to recover from.  He then had sinusitis requiring another gap in therapy for about 2 weeks.  During the gap in therapy he noticed an increase pain and stiffness but overall his symptoms have been manageable.  He denies any signs or symptoms of a rheumatoid arthritis flare at this time.  He has not had any morning stiffness, nocturnal pain, or difficulty performing ADLs.  He denies any joint swelling at this time.     Activities of Daily Living:  Patient reports morning stiffness for 0 minute.   Patient Denies nocturnal pain.  Difficulty dressing/grooming: Denies Difficulty climbing stairs: Denies Difficulty getting out of chair: Denies Difficulty using hands for taps, buttons, cutlery, and/or writing: Denies  Review of Systems  Constitutional:  Negative for fatigue.  HENT:  Negative for mouth sores and mouth dryness.   Eyes:  Positive for dryness.  Respiratory:  Negative for shortness of breath.   Cardiovascular:  Negative for chest pain and palpitations.  Gastrointestinal:  Negative for blood in stool, constipation and diarrhea.  Endocrine: Negative for increased urination.  Genitourinary:  Negative for involuntary urination.  Musculoskeletal:  Negative for joint pain, gait problem, joint pain, joint swelling, myalgias, muscle  weakness, morning stiffness, muscle tenderness and myalgias.  Skin:  Negative for color change, rash, hair loss and sensitivity to sunlight.  Allergic/Immunologic: Positive for susceptible to infections.  Neurological:  Negative for dizziness and headaches.  Hematological:  Negative for swollen glands.  Psychiatric/Behavioral:  Positive for sleep disturbance. Negative for depressed mood. The patient is not nervous/anxious.     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) 08/27/2023    Past Medical History:  Diagnosis Date   Erectile dysfunction    Hyperlipidemia    Hypertension     Family History  Problem Relation Age of Onset   Lupus Sister    Heart attack Paternal Grandfather    Psoriasis Son        Scalp   Past Surgical History:  Procedure Laterality Date   SKIN SURGERY     Social History   Tobacco Use   Smoking status: Former    Current packs/day: 0.00    Types: Cigarettes    Quit date: 08/26/1979    Years since quitting: 44.3    Passive exposure: Past   Smokeless tobacco: Never   Tobacco comments:    Smoked 1 ppd for 44 years 08/26/23  Vaping Use   Vaping status: Never Used  Substance Use Topics   Alcohol use: Not Currently    Comment: none  Drug use: Not Currently   Social History   Social History Narrative   Not on file     Immunization History  Administered Date(s) Administered   Covid-19 Iv Non-us  Vaccine (Bibp, Sinopharm) 06/04/2019, 06/25/2019   Fluad Quad(high Dose 65+) 12/01/2021   Influenza, Quadrivalent, Recombinant, Inj, Pf 12/21/2017   Influenza-Unspecified 01/18/2023   Moderna SARS-COV2 Booster Vaccination 01/18/2023   PFIZER(Purple Top)SARS-COV-2 Vaccination 06/04/2019   PNEUMOCOCCAL CONJUGATE-20 12/01/2021   Zoster Recombinant(Shingrix) 10/12/2017, 01/18/2018     Objective: Vital Signs: BP  134/73   Pulse (!) 58   Temp 97.6 F (36.4 C)   Resp 14   Ht 6' 1 (1.854 m)   Wt 222 lb 9.6 oz (101 kg)   BMI 29.37 kg/m    Physical Exam Vitals and nursing note reviewed.  Constitutional:      Appearance: He is well-developed.  HENT:     Head: Normocephalic and atraumatic.  Eyes:     Conjunctiva/sclera: Conjunctivae normal.     Pupils: Pupils are equal, round, and reactive to light.  Cardiovascular:     Rate and Rhythm: Normal rate and regular rhythm.     Heart sounds: Normal heart sounds.  Pulmonary:     Effort: Pulmonary effort is normal.     Breath sounds: Normal breath sounds.  Abdominal:     General: Bowel sounds are normal.     Palpations: Abdomen is soft.  Musculoskeletal:     Cervical back: Normal range of motion and neck supple.  Skin:    General: Skin is warm and dry.     Capillary Refill: Capillary refill takes less than 2 seconds.  Neurological:     Mental Status: He is alert and oriented to person, place, and time.  Psychiatric:        Behavior: Behavior normal.      Musculoskeletal Exam: C-spine has limited range of motion with lateral rotation.  Postural thoracic kyphosis noted.  Shoulder joints, elbow joints, wrist joints, MCPs, PIPs, DIPs have good range of motion with no synovitis.  Complete fist formation bilaterally.  Hip joints have good range of motion with no groin pain.  Knee joints have good range of motion no warmth or effusion.  Ankle joints have good range of motion no tenderness or joint swelling.    CDAI Exam: CDAI Score: -- Patient Global: --; Provider Global: -- Swollen: --; Tender: -- Joint Exam 01/02/2024   No joint exam has been documented for this visit   There is currently no information documented on the homunculus. Go to the Rheumatology activity and complete the homunculus joint exam.  Investigation: No additional findings.  Imaging: No results found.  Recent Labs: Lab Results  Component Value Date   WBC 6.3  12/02/2023   HGB 13.7 12/02/2023   PLT 269 12/02/2023   NA 139 12/02/2023   K 4.8 12/02/2023   CL 102 12/02/2023   CO2 31 12/02/2023   GLUCOSE 86 12/02/2023   BUN 17 12/02/2023   CREATININE 1.19 12/02/2023   BILITOT 0.4 12/02/2023   ALKPHOS 47 06/05/2021   AST 18 12/02/2023   ALT 16 12/02/2023   PROT 6.6 12/02/2023   ALBUMIN 3.8 06/05/2021   CALCIUM 9.4 12/02/2023   QFTBGOLDPLUS NEGATIVE 09/03/2023    Speciality Comments: No specialty comments available.  Procedures:  No procedures performed Allergies: Patient has no known allergies.   Assessment / Plan:     Visit Diagnoses: Rheumatoid arthritis involving multiple sites with positive rheumatoid factor (HCC) -  positive RF 78, inflammatory arthritis, pleural effusion: He has no synovitis on examination today.  He has not had any morning stiffness, nocturnal pain, or difficulty performing ADLs.  Overall his symptoms remain well-controlled on the current treatment regimen.  He remains on methotrexate  0.8 mL or subcu injections once weekly and folic acid  2 mg daily.  He is tolerating methotrexate  without any side effects or injection site reactions.  Since his last office visit he had to have a gap in therapy due to recovering from the norovirus followed by a gap for about 2 weeks while recovering from sinusitis.  He has no signs of active inflammation on examination today.  He will remain on Methotrexate  0.8 ml sq injections once weekly and folic acid  2 mg daily.   He was advised to notify us  if he develops any new or worsening symptoms. He will follow-up in the office in 5 months or sooner if needed.  High risk medication use - Methotrexate  0.8 mL sq injections once weekly and folic acid  2 mg by mouth daily.  CBC and CMP updated on 12/02/23.  His next lab work will be due in mid December and every 3 months to monitor for drug toxicity. Discussed the importance of holding methotrexate  if he develops signs or symptoms of an infection and to  resume once the infection has completely cleared.  Pleural effusion on right - Recurrent pleural effusion and thoracentesis x 2.  No SOB or pleuritic chest pain.  Lungs clear today.   Shortness of breath: Under care of [facility]. Lungs clear to auscultation today.  He is not experiencing any shortness of breath or pleuritic chest pain at this time.  Abnormal SPEP: Followed by hematology.   Pain in both hands - X-rays of bilateral hands showed juxta-articular osteopenia and osteoarthritic changes.  No tenderness or synovitis noted.  No morning stiffness or nocturnal pain.  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  No warmth or effusion noted.  Pain in both feet: He is not experiencing any discomfort in his feet at this time.  He has good range of motion of both ankle joints with no tenderness or joint swelling.  Neck pain: Previous x-rays showed significant disc space narrowing. C-spine has limited ROM with lateral rotation.  No symptoms of radiculopathy  Other fatigue: Stable.  Other medical conditions are listed as follows:  Primary hypertension: Blood pressure was 134/73 today in the office.  Seasonal allergic rhinitis due to other allergic trigger  Hypogonadism in male  Former smoker  Alcohol use  Family history of systemic lupus erythematosus-sister  Family history of psoriasis-son  Orders: No orders of the defined types were placed in this encounter.  No orders of the defined types were placed in this encounter.   Follow-Up Instructions: Return in about 5 months (around 06/01/2024) for Rheumatoid arthritis.   Waddell CHRISTELLA Craze, PA-C  Note - This record has been created using Dragon software.  Chart creation errors have been sought, but may not always  have been located. Such creation errors do not reflect on  the standard of medical care.

## 2023-12-23 ENCOUNTER — Ambulatory Visit (INDEPENDENT_AMBULATORY_CARE_PROVIDER_SITE_OTHER): Admitting: Nurse Practitioner

## 2023-12-23 ENCOUNTER — Encounter: Payer: Self-pay | Admitting: Nurse Practitioner

## 2023-12-23 VITALS — BP 126/70 | HR 60 | Temp 98.0°F | Ht 72.0 in | Wt 225.4 lb

## 2023-12-23 DIAGNOSIS — M05762 Rheumatoid arthritis with rheumatoid factor of left knee without organ or systems involvement: Secondary | ICD-10-CM

## 2023-12-23 DIAGNOSIS — B9689 Other specified bacterial agents as the cause of diseases classified elsewhere: Secondary | ICD-10-CM | POA: Diagnosis not present

## 2023-12-23 DIAGNOSIS — M05761 Rheumatoid arthritis with rheumatoid factor of right knee without organ or systems involvement: Secondary | ICD-10-CM

## 2023-12-23 DIAGNOSIS — J90 Pleural effusion, not elsewhere classified: Secondary | ICD-10-CM

## 2023-12-23 DIAGNOSIS — J019 Acute sinusitis, unspecified: Secondary | ICD-10-CM | POA: Diagnosis not present

## 2023-12-23 MED ORDER — CEFDINIR 300 MG PO CAPS: 300.0000 mg | ORAL_CAPSULE | Freq: Two times a day (BID) | ORAL | 0 refills | Status: AC

## 2023-12-23 MED ORDER — FLUTICASONE PROPIONATE 50 MCG/ACT NA SUSP: 2.0000 | Freq: Every day | NASAL | 2 refills | Status: DC

## 2023-12-23 NOTE — Progress Notes (Unsigned)
 @Patient  ID: Barry Barry, male    DOB: [birth year], 68 y.o.   MRN: [id number]  No chief complaint on file.   Referring provider: Regino Slater, MD  HPI:   TEST/EVENTS:   No Known Allergies  Immunization History  Administered Date(s) Administered   Covid-19 Iv Non-us  Vaccine (Bibp, Sinopharm) 06/04/2019, 06/25/2019   Fluad Quad(high Dose 65+) 12/01/2021   Influenza, Quadrivalent, Recombinant, Inj, Pf 12/21/2017   Influenza-Unspecified 01/18/2023   Moderna SARS-COV2 Booster Vaccination 01/18/2023   PFIZER(Purple Top)SARS-COV-2 Vaccination 06/04/2019   PNEUMOCOCCAL CONJUGATE-20 12/01/2021   Zoster Recombinant(Shingrix) 10/12/2017, 01/18/2018    Past Medical History:  Diagnosis Date   Erectile dysfunction    Hyperlipidemia    Hypertension     Tobacco History: Social History   Tobacco Use  Smoking Status Former   Current packs/day: 0.00   Types: Cigarettes   Quit date: 08/26/1979   Years since quitting: 44.3   Passive exposure: Past  Smokeless Tobacco Never  Tobacco Comments   Smoked 1 ppd for 44 years 08/26/23   Counseling given: Not Answered Tobacco comments: Smoked 1 ppd for 44 years 08/26/23   Outpatient Medications Prior to Visit  Medication Sig Dispense Refill   amLODipine (NORVASC) 5 MG tablet Take 5 mg by mouth daily.     anastrozole (ARIMIDEX) 1 MG tablet Take 1 mg by mouth daily.     folic acid  (FOLVITE ) 1 MG tablet Take 2 tablets (2 mg total) by mouth daily. 180 tablet 3   KYZATREX 150 MG CAPS Take 2 capsules by mouth 2 (two) times daily.     Loratadine 10 MG CAPS Take 10 mg by mouth daily.     Methotrexate  Sodium (METHOTREXATE , PF,) 50 MG/2ML injection INJECT 0.8 ML INTO SKIN ONCE WEEKLY. 8 mL 2   predniSONE  (DELTASONE ) 10 MG tablet Take 20mg  po once daily x 7 days, then 15mg  daily x 7 days, then 10mg  daily x 7 days, then 5 mg daily x 7 days (Patient not taking: Reported on 10/30/2023) 35 tablet 0   tadalafil (CIALIS) 5 MG tablet Take 5 mg by mouth  daily.     TUBERCULIN SYR 1CC/27GX1/2 (B-D TB SYRINGE 1CC/27GX1/2) 27G X 1/2 1 ML MISC 12 Syringes by Does not apply route once a week. 12 each 3   TURMERIC PO Take by mouth.     UNABLE TO FIND NUTRITEARS     No facility-administered medications prior to visit.     Review of Systems:   Constitutional: No weight loss or gain, night sweats, fevers, chills, fatigue, or lassitude. HEENT: No headaches, difficulty swallowing, tooth/dental problems, or sore throat. No sneezing, itching, ear ache, nasal congestion, or post nasal drip CV:  No chest pain, orthopnea, PND, swelling in lower extremities, anasarca, dizziness, palpitations, syncope Resp: No shortness of breath with exertion or at rest. No excess mucus or change in color of mucus. No productive or non-productive. No hemoptysis. No wheezing.  No chest wall deformity GI:  No heartburn, indigestion, abdominal pain, nausea, vomiting, diarrhea, change in bowel habits, loss of appetite, bloody stools.  GU: No dysuria, change in color of urine, urgency or frequency.  No flank pain, no hematuria  Skin: No rash, lesions, ulcerations MSK:  No joint pain or swelling.  No decreased range of motion.  No back pain. Neuro: No dizziness or lightheadedness.  Psych: No depression or anxiety. Mood stable.     Physical Exam:  There were no vitals taken for this visit.  GEN: Pleasant, interactive, well-nourished/chronically-ill  appearing/acutely-ill appearing/poorly-nourished/morbidly obese; in no acute distress.****** HEENT:  Normocephalic and atraumatic. EACs patent bilaterally. TM pearly gray with present light reflex bilaterally. PERRLA. Sclera white. Nasal turbinates pink, moist and patent bilaterally. No rhinorrhea present. Oropharynx pink and moist, without exudate or edema. No lesions, ulcerations, or postnasal drip.  NECK:  Supple w/ fair ROM. No JVD present. Normal carotid impulses w/o bruits. Thyroid  symmetrical with no goiter or nodules  palpated. No lymphadenopathy.   CV: RRR, no m/r/g, no peripheral edema. Pulses intact, +2 bilaterally. No cyanosis, pallor or clubbing. PULMONARY:  Unlabored, regular breathing. Clear bilaterally A&P w/o wheezes/rales/rhonchi. No accessory muscle use.  GI: BS present and normoactive. Soft, non-tender to palpation. No organomegaly or masses detected. No CVA tenderness. MSK: No erythema, warmth or tenderness. Cap refil <2 sec all extrem. No deformities or joint swelling noted.  Neuro: A/Ox3. No focal deficits noted.   Skin: Warm, no lesions or rashe Psych: Normal affect and behavior. Judgement and thought content appropriate.     Lab Results:  CBC    Component Value Date/Time   WBC 6.3 12/02/2023 1158   RBC 5.03 12/02/2023 1158   HGB 13.7 12/02/2023 1158   HCT 43.6 12/02/2023 1158   PLT 269 12/02/2023 1158   MCV 86.7 12/02/2023 1158   MCH 27.2 12/02/2023 1158   MCHC 31.4 (L) 12/02/2023 1158   RDW 16.1 (H) 12/02/2023 1158   LYMPHSABS 1.3 08/26/2023 1330   MONOABS 0.5 08/26/2023 1330   EOSABS 101 12/02/2023 1158   BASOSABS 38 12/02/2023 1158    BMET    Component Value Date/Time   NA 139 12/02/2023 1158   K 4.8 12/02/2023 1158   CL 102 12/02/2023 1158   CO2 31 12/02/2023 1158   GLUCOSE 86 12/02/2023 1158   BUN 17 12/02/2023 1158   CREATININE 1.19 12/02/2023 1158   CALCIUM 9.4 12/02/2023 1158   GFRNONAA >60 08/14/2023 1438    BNP    Component Value Date/Time   BNP 32.9 08/14/2023 1438     Imaging:  No results found.  Administration History     None           No data to display          No results found for: NITRICOXIDE      Assessment & Plan:   No problem-specific Assessment & Plan notes found for this encounter.   Advised if symptoms do not improve or worsen, to please contact office for sooner follow up or seek emergency care.   I spent *** minutes of dedicated to the care of this patient on the date of this encounter to include  pre-visit review of records, face-to-face time with the patient discussing conditions above, post visit ordering of testing, clinical documentation with the electronic health record, making appropriate referrals as documented, and communicating necessary findings to members of the patients care team.  Comer LULLA Rouleau, NP 12/23/2023  Pt aware and understands NP's role.

## 2023-12-23 NOTE — Patient Instructions (Addendum)
 Start flonase nasal spray 2 sprays each nostril daily.  Start saline rinses 1-2 times a day with bottled distilled water and then follow with the flonase 30 minutes later  Continue loratadine (Claritin) 1 tab daily  Cefdinir 1 pill Twice daily for 7 days. Take with food  Monitor for any new fevers or worsening respiratory symptoms. If these occur, notify us  and we can obtain a chest x ray. If severe, go to the hospital   Follow up in 3-4 months with Dr. Tamea or Katie Verdis Koval,NP. If symptoms do not improve or worsen, please contact office for sooner follow up or seek emergency care.

## 2023-12-24 ENCOUNTER — Encounter: Payer: Self-pay | Admitting: Nurse Practitioner

## 2024-01-02 ENCOUNTER — Ambulatory Visit: Attending: Physician Assistant | Admitting: Physician Assistant

## 2024-01-02 ENCOUNTER — Encounter: Payer: Self-pay | Admitting: Physician Assistant

## 2024-01-02 VITALS — BP 134/73 | HR 58 | Temp 97.6°F | Resp 14 | Ht 73.0 in | Wt 222.6 lb

## 2024-01-02 DIAGNOSIS — M25562 Pain in left knee: Secondary | ICD-10-CM

## 2024-01-02 DIAGNOSIS — J3089 Other allergic rhinitis: Secondary | ICD-10-CM

## 2024-01-02 DIAGNOSIS — Z8269 Family history of other diseases of the musculoskeletal system and connective tissue: Secondary | ICD-10-CM

## 2024-01-02 DIAGNOSIS — E291 Testicular hypofunction: Secondary | ICD-10-CM

## 2024-01-02 DIAGNOSIS — R5383 Other fatigue: Secondary | ICD-10-CM

## 2024-01-02 DIAGNOSIS — Z79899 Other long term (current) drug therapy: Secondary | ICD-10-CM

## 2024-01-02 DIAGNOSIS — M79672 Pain in left foot: Secondary | ICD-10-CM

## 2024-01-02 DIAGNOSIS — G8929 Other chronic pain: Secondary | ICD-10-CM

## 2024-01-02 DIAGNOSIS — M79641 Pain in right hand: Secondary | ICD-10-CM

## 2024-01-02 DIAGNOSIS — R0602 Shortness of breath: Secondary | ICD-10-CM

## 2024-01-02 DIAGNOSIS — I10 Essential (primary) hypertension: Secondary | ICD-10-CM

## 2024-01-02 DIAGNOSIS — F1090 Alcohol use, unspecified, uncomplicated: Secondary | ICD-10-CM

## 2024-01-02 DIAGNOSIS — J90 Pleural effusion, not elsewhere classified: Secondary | ICD-10-CM | POA: Diagnosis not present

## 2024-01-02 DIAGNOSIS — M0579 Rheumatoid arthritis with rheumatoid factor of multiple sites without organ or systems involvement: Secondary | ICD-10-CM | POA: Diagnosis not present

## 2024-01-02 DIAGNOSIS — M79671 Pain in right foot: Secondary | ICD-10-CM

## 2024-01-02 DIAGNOSIS — M25561 Pain in right knee: Secondary | ICD-10-CM

## 2024-01-02 DIAGNOSIS — R778 Other specified abnormalities of plasma proteins: Secondary | ICD-10-CM

## 2024-01-02 DIAGNOSIS — M79642 Pain in left hand: Secondary | ICD-10-CM

## 2024-01-02 DIAGNOSIS — Z87891 Personal history of nicotine dependence: Secondary | ICD-10-CM

## 2024-01-02 DIAGNOSIS — Z840 Family history of diseases of the skin and subcutaneous tissue: Secondary | ICD-10-CM

## 2024-01-02 DIAGNOSIS — M542 Cervicalgia: Secondary | ICD-10-CM

## 2024-01-02 NOTE — Patient Instructions (Signed)
 Standing Labs We placed an order today for your standing lab work.   Please have your standing labs drawn in mid-December and every 3 months   Please have your labs drawn 2 weeks prior to your appointment so that the provider can discuss your lab results at your appointment, if possible.  Please note that you may see your imaging and lab results in MyChart before we have reviewed them. We will contact you once all results are reviewed. Please allow our office up to 72 hours to thoroughly review all of the results before contacting the office for clarification of your results.  WALK-IN LAB HOURS  Monday through Thursday from 8:00 am -12:30 pm and 1:00 pm-4:30 pm and Friday from 8:00 am-12:00 pm.  Patients with office visits requiring labs will be seen before walk-in labs.  You may encounter longer than normal wait times. Please allow additional time. Wait times may be shorter on  Monday and Thursday afternoons.  We do not book appointments for walk-in labs. We appreciate your patience and understanding with our staff.   Labs are drawn by [facility]. Please bring your co-pay at the time of your lab draw.  You may receive a bill from [facility] for your lab work.  Please note if you are on Hydroxychloroquine and and an order has been placed for a Hydroxychloroquine level,  you will need to have it drawn 4 hours or more after your last dose.  If you wish to have your labs drawn at another location, please call the office 24 hours in advance so we can fax the orders.  The office is located at [address], [place], [place] [address]   If you have any questions regarding directions or hours of operation,  please call [phone].   As a reminder, please drink plenty of water prior to coming for your lab work. Thanks!

## 2024-01-14 DIAGNOSIS — E785 Hyperlipidemia, unspecified: Secondary | ICD-10-CM | POA: Diagnosis not present

## 2024-01-14 DIAGNOSIS — Z125 Encounter for screening for malignant neoplasm of prostate: Secondary | ICD-10-CM | POA: Diagnosis not present

## 2024-01-14 DIAGNOSIS — R7303 Prediabetes: Secondary | ICD-10-CM | POA: Diagnosis not present

## 2024-01-14 DIAGNOSIS — I10 Essential (primary) hypertension: Secondary | ICD-10-CM | POA: Diagnosis not present

## 2024-01-14 DIAGNOSIS — Z0000 Encounter for general adult medical examination without abnormal findings: Secondary | ICD-10-CM | POA: Diagnosis not present

## 2024-01-16 DIAGNOSIS — E291 Testicular hypofunction: Secondary | ICD-10-CM | POA: Diagnosis not present

## 2024-01-16 DIAGNOSIS — N5201 Erectile dysfunction due to arterial insufficiency: Secondary | ICD-10-CM | POA: Diagnosis not present

## 2024-01-17 DIAGNOSIS — Z23 Encounter for immunization: Secondary | ICD-10-CM | POA: Diagnosis not present

## 2024-01-17 DIAGNOSIS — I10 Essential (primary) hypertension: Secondary | ICD-10-CM | POA: Diagnosis not present

## 2024-01-17 DIAGNOSIS — E7800 Pure hypercholesterolemia, unspecified: Secondary | ICD-10-CM | POA: Diagnosis not present

## 2024-01-17 DIAGNOSIS — Z0000 Encounter for general adult medical examination without abnormal findings: Secondary | ICD-10-CM | POA: Diagnosis not present

## 2024-01-17 DIAGNOSIS — E291 Testicular hypofunction: Secondary | ICD-10-CM | POA: Diagnosis not present

## 2024-01-28 ENCOUNTER — Ambulatory Visit: Admitting: Pulmonary Disease

## 2024-01-28 ENCOUNTER — Encounter

## 2024-01-28 ENCOUNTER — Encounter: Payer: Self-pay | Admitting: Pulmonary Disease

## 2024-01-28 ENCOUNTER — Ambulatory Visit

## 2024-01-28 ENCOUNTER — Ambulatory Visit (INDEPENDENT_AMBULATORY_CARE_PROVIDER_SITE_OTHER): Admitting: Pulmonary Disease

## 2024-01-28 VITALS — BP 116/66 | HR 60 | Temp 97.9°F | Ht 73.0 in | Wt 225.0 lb

## 2024-01-28 DIAGNOSIS — R0982 Postnasal drip: Secondary | ICD-10-CM | POA: Diagnosis not present

## 2024-01-28 DIAGNOSIS — M059 Rheumatoid arthritis with rheumatoid factor, unspecified: Secondary | ICD-10-CM

## 2024-01-28 DIAGNOSIS — R0602 Shortness of breath: Secondary | ICD-10-CM

## 2024-01-28 DIAGNOSIS — R052 Subacute cough: Secondary | ICD-10-CM

## 2024-01-28 DIAGNOSIS — J90 Pleural effusion, not elsewhere classified: Secondary | ICD-10-CM

## 2024-01-28 LAB — PULMONARY FUNCTION TEST
DL/VA % pred: 123 %
DL/VA: 4.99 ml/min/mmHg/L
DLCO cor % pred: 112 %
DLCO cor: 32.66 ml/min/mmHg
DLCO unc % pred: 109 %
DLCO unc: 31.8 ml/min/mmHg
FEF 25-75 Post: 2.94 L/s
FEF 25-75 Pre: 2.06 L/s
FEF2575-%Change-Post: 42 %
FEF2575-%Pred-Post: 100 %
FEF2575-%Pred-Pre: 70 %
FEV1-%Change-Post: 8 %
FEV1-%Pred-Post: 88 %
FEV1-%Pred-Pre: 81 %
FEV1-Post: 3.34 L
FEV1-Pre: 3.09 L
FEV1FVC-%Change-Post: 3 %
FEV1FVC-%Pred-Pre: 96 %
FEV6-%Change-Post: 3 %
FEV6-%Pred-Post: 91 %
FEV6-%Pred-Pre: 88 %
FEV6-Post: 4.44 L
FEV6-Pre: 4.29 L
FEV6FVC-%Change-Post: 0 %
FEV6FVC-%Pred-Post: 104 %
FEV6FVC-%Pred-Pre: 104 %
FVC-%Change-Post: 4 %
FVC-%Pred-Post: 88 %
FVC-%Pred-Pre: 84 %
FVC-Post: 4.49 L
FVC-Pre: 4.3 L
Post FEV1/FVC ratio: 74 %
Post FEV6/FVC ratio: 99 %
Pre FEV1/FVC ratio: 72 %
Pre FEV6/FVC Ratio: 100 %
RV % pred: 133 %
RV: 3.43 L
TLC % pred: 97 %
TLC: 7.52 L

## 2024-01-28 LAB — NITRIC OXIDE: Nitric Oxide: 17

## 2024-01-28 NOTE — Patient Instructions (Signed)
 Full pft performed today

## 2024-01-28 NOTE — Patient Instructions (Addendum)
 VISIT SUMMARY:  Today, we discussed your recurrent pleural effusion, upper respiratory symptoms, and overall health. Your breathing tests show good lung function, and your rheumatoid arthritis is well-managed with methotrexate . We also talked about your recent increase in infections and how to manage your allergy symptoms more effectively.  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 Your lung function is good, and methotrexate  is effectively managing your rheumatoid arthritis and preventing pleural effusion recurrence. Continue taking methotrexate , and we will monitor your lung function tests for any changes. We will follow up in six months, and if everything remains stable, we may extend the follow-up to one year.  -UPPER RESPIRATORY SYMPTOMS (POSTNASAL DRIP AND MILD COUGH): Your mild upper respiratory symptoms, such as postnasal drip and cough, are likely due to allergies. We recommend switching from loratadine to cetirizine at bedtime to better manage your allergies. Continue using a vaporizer at night to help with your cough, and consider using over-the-counter Delsym for additional cough relief. When you are outdoors and exposed to allergens, wearing a mask can help reduce your symptoms.  INSTRUCTIONS:  Please continue taking methotrexate  as prescribed and switch to cetirizine at bedtime for your allergies. Use a vaporizer at night and consider over-the-counter Delsym for your cough. We will monitor your lung function and follow up in six months. If your condition remains stable, we may extend the follow-up to one year.

## 2024-01-28 NOTE — Progress Notes (Signed)
 Full pft performed today

## 2024-01-28 NOTE — Progress Notes (Unsigned)
 Subjective:    Patient ID: Barry Barry, male    DOB: 1956-03-04, 68 y.o.   MRN: [id number]  Patient Care Team: Regino Slater, MD as PCP - General (Family Medicine) Tamea Dedra CROME, MD as Consulting Physician (Pulmonary Disease) Dolphus Reiter, MD as Consulting Physician (Rheumatology)  Chief Complaint  Patient presents with   Medical Management of Chronic Issues    Cough x 3 days.     BACKGROUND/INTERVAL:Patient is a 68 year old former smoker who presents for follow-up of a right pleural effusion.  Patient was seen in the emergency room on 14 Aug 2023 and had thoracentesis done at that time.  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  Patient has been diagnosed with rheumatoid arthritis.  He was first evaluated here on 26 August 2023.  Last seen 6 30 October 2023.  No clinical indication of reaccumulation of effusion.  Patient is on treatment for rheumatoid arthritis with methotrexate  subcu.  HPI Discussed the use of AI scribe software for clinical note transcription with the patient, who gave verbal consent to proceed.  History of Present Illness   Barry Barry is a 68 year old male with rheumatoid arthritis who presents after initial presentation with pleural effusion.  He is accompanied by his wife Barry Barry.  He had PFTs today that showed an FEV1 of 3.34 L or 88 % predicted, FVC of 4.49 L or 88 % predicted, FEV1/FVC of 72%.  No bronchodilator response.  Normal lung volumes with some mild air trapping noted.  Diffusion capacity normal to supranormal.  He has been experiencing more frequent infections, including a recent nasal infection and nasal drip, which he attributes to methotrexate  use. He has a mild runny nose and a very mild cough with minimal phlegm, and he uses a vaporizer at night to alleviate the cough. He is currently taking loratadine for allergies.  He mentions a history of norovirus infection and is concerned that methotrexate , which he is taking  for rheumatoid arthritis, may be lowering his immune system, leading to increased susceptibility to infections. He has been experiencing more 'little bugs' than he did a year ago.  Socially, he has recently taken up surfing again, a hobby from his youth in [place], and has been engaging in this activity at a surfing pool in [place].     He has not had any shortness of breath.  No chest pain.  No orthopnea or paroxysmal nocturnal dyspnea.  No lower extremity edema.  Occasional dry cough as noted but no sputum production or hemoptysis.  Review of Systems A 10 point review of systems was performed and it is as noted above otherwise negative.   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   anastrozole (ARIMIDEX) 1 MG tablet Take 1 mg  by mouth daily.   folic acid  (FOLVITE ) 1 MG tablet Take 2 tablets (2 mg total) by mouth daily.   KYZATREX 150 MG CAPS Take 2 capsules by mouth 2 (two) times daily. (Patient taking differently: Take 1 capsule by mouth 2 (two) times daily.)   Loratadine 10 MG CAPS Take 10 mg by mouth daily.   Methotrexate  Sodium (METHOTREXATE , PF,) 50 MG/2ML injection INJECT 0.8 ML INTO SKIN ONCE WEEKLY.   tadalafil (CIALIS) 5 MG tablet Take 5 mg by mouth daily.   TUBERCULIN SYR 1CC/27GX1/2 (B-D TB SYRINGE 1CC/27GX1/2) 27G X 1/2 1 ML MISC 12 Syringes by Does not apply route  once a week.    Immunization History  Administered Date(s) Administered   Covid-19 Iv Non-us  Vaccine (Bibp, Sinopharm) 06/25/2019   Fluad Quad(high Dose 65+) 12/01/2021   Fluzone Influenza virus vaccine,trivalent (IIV3), split virus 01/07/2012, 01/16/2013, 01/18/2018, 12/16/2020   INFLUENZA, HIGH DOSE SEASONAL PF 01/17/2024   Influenza, Quadrivalent, Recombinant, Inj, Pf 12/21/2017   Influenza,inj,Quad PF,6+ Mos 11/18/2018, 12/25/2019   Influenza,inj,quad, With Preservative 02/15/2014, 12/14/2014   Influenza-Unspecified 01/18/2023   Moderna SARS-COV2 Booster Vaccination 01/18/2023   PFIZER(Purple Top)SARS-COV-2 Vaccination 06/04/2019, 12/25/2019   PNEUMOCOCCAL CONJUGATE-20 11/01/2021, 12/01/2021   Pfizer Covid-19 Vaccine Bivalent Booster 52yrs & up 12/16/2020   Td 10/07/2017   Tdap 08/22/2007   Zoster Recombinant(Shingrix) 10/12/2017, 01/18/2018   Zoster, Live 01/18/2018, 05/18/2018        Objective:     BP 116/66   Pulse 60   Temp 97.9 F (36.6 C) (Temporal)   Ht 6' 1 (1.854 m)   Wt 225 lb (102.1 kg)   SpO2 96%   BMI 29.69 kg/m   SpO2: 96 %  GENERAL: Well-developed, well-nourished gentleman, no acute distress.  No conversational dyspnea, fully ambulatory. HEAD: Normocephalic, atraumatic.  EYES: Pupils equal, round, reactive to light.  No scleral icterus.  MOUTH: Oral mucosa moist. NECK: Supple. No thyromegaly. Trachea midline. No JVD.  No adenopathy. PULMONARY: Good air entry bilaterally, symmetrical.  No adventitious sounds. CARDIOVASCULAR: S1 and S2. Regular rate and rhythm.  No rubs, murmurs or gallops heard. ABDOMEN: Benign. MUSCULOSKELETAL: No active synovitis noted, no clubbing, no edema.  NEUROLOGIC: No overt focal deficit, no gait disturbance, speech is fluent. SKIN: Intact,warm,dry. PSYCH: Mood and behavior normal.  Lab Results  Component Value Date   NITRICOXIDE 17 01/28/2024  *This result suggests low (<25) Type II (T2) airway inflammation  indicating a low likelihood of active T2-driven airway inflammation.  In a patient with active T2 driven asthma management it suggests good control.     Assessment & Plan:     ICD-10-CM   1. Rheumatoid arthritis with positive rheumatoid factor, involving unspecified site (HCC)  M05.9     2. Subacute cough  R05.2 Nitric oxide    3. Pleural effusion on right - RESOLVED  J90       Orders Placed This Encounter  Procedures   Nitric oxide   Discussion:    Pleural effusion in the setting of rheumatoid arthritis No clinical recurrence of pleural effusion. Pulmonary function tests show FEV1 at 88% and excellent oxygen carrying capacity. Methotrexate  is effectively managing rheumatoid arthritis and preventing pleural effusion recurrence. Rheumatoid arthritis can affect the lungs, particularly in males, necessitating monitoring for lung fibrosis or scarring. - Continue methotrexate  therapy. - Monitor pulmonary function tests for changes in lung volumes or oxygen carrying capacity. - Scheduled follow-up in six months, with consideration for extending to one year if stable.  Upper respiratory symptoms (  postnasal drip and mild cough) Mild upper respiratory symptoms likely due to postnasal drip and environmental allergens. No airway inflammation. Current loratadine treatment may be insufficient. - Switch from loratadine to cetirizine at bedtime for allergy management. - Use a vaporizer to alleviate cough symptoms. - Consider over-the-counter Delsym for cough relief. - Wear a mask when exposed to allergens outdoors.      Advised if symptoms do not improve or worsen, to please contact office for sooner follow up or seek emergency care.    I spent 32 minutes of dedicated to the care of this patient on the date of this encounter to include pre-visit review of records, face-to-face time with the patient discussing conditions above, post visit ordering of testing, clinical documentation with the  electronic health record, making appropriate referrals as documented, and communicating necessary findings to members of the patients care team.     C. Leita Sanders, MD [facility]  [facility]    *This note was generated using voice recognition software/Dragon and/or AI transcription program.  Despite best efforts to proofread, errors can occur which can change the meaning. Any transcriptional errors that result from this process are unintentional and may not be fully corrected at the time of dictation.

## 2024-02-04 DIAGNOSIS — Z1211 Encounter for screening for malignant neoplasm of colon: Secondary | ICD-10-CM | POA: Diagnosis not present

## 2024-03-05 ENCOUNTER — Other Ambulatory Visit: Payer: Self-pay | Admitting: *Deleted

## 2024-03-05 DIAGNOSIS — Z79899 Other long term (current) drug therapy: Secondary | ICD-10-CM

## 2024-03-06 ENCOUNTER — Ambulatory Visit: Payer: Self-pay | Admitting: Rheumatology

## 2024-03-06 LAB — COMPREHENSIVE METABOLIC PANEL WITH GFR
AG Ratio: 1.7 (calc) (ref 1.0–2.5)
ALT: 16 U/L (ref 9–46)
AST: 14 U/L (ref 10–35)
Albumin: 4 g/dL (ref 3.6–5.1)
Alkaline phosphatase (APISO): 52 U/L (ref 35–144)
BUN: 15 mg/dL (ref 7–25)
CO2: 30 mmol/L (ref 20–32)
Calcium: 9.2 mg/dL (ref 8.6–10.3)
Chloride: 106 mmol/L (ref 98–110)
Creat: 1.07 mg/dL (ref 0.70–1.35)
Globulin: 2.4 g/dL (ref 1.9–3.7)
Glucose, Bld: 97 mg/dL (ref 65–99)
Potassium: 4.8 mmol/L (ref 3.5–5.3)
Sodium: 142 mmol/L (ref 135–146)
Total Bilirubin: 0.5 mg/dL (ref 0.2–1.2)
Total Protein: 6.4 g/dL (ref 6.1–8.1)
eGFR: 76 mL/min/1.73m2

## 2024-03-06 LAB — CBC WITH DIFFERENTIAL/PLATELET
Absolute Lymphocytes: 1076 {cells}/uL (ref 850–3900)
Absolute Monocytes: 447 {cells}/uL (ref 200–950)
Basophils Absolute: 38 {cells}/uL (ref 0–200)
Basophils Relative: 0.8 %
Eosinophils Absolute: 188 {cells}/uL (ref 15–500)
Eosinophils Relative: 4 %
HCT: 47 % (ref 39.4–51.1)
Hemoglobin: 14.8 g/dL (ref 13.2–17.1)
MCH: 27.9 pg (ref 27.0–33.0)
MCHC: 31.5 g/dL — ABNORMAL LOW (ref 31.6–35.4)
MCV: 88.5 fL (ref 81.4–101.7)
MPV: 11.2 fL (ref 7.5–12.5)
Monocytes Relative: 9.5 %
Neutro Abs: 2952 {cells}/uL (ref 1500–7800)
Neutrophils Relative %: 62.8 %
Platelets: 214 Thousand/uL (ref 140–400)
RBC: 5.31 Million/uL (ref 4.20–5.80)
RDW: 14.8 % (ref 11.0–15.0)
Total Lymphocyte: 22.9 %
WBC: 4.7 Thousand/uL (ref 3.8–10.8)

## 2024-03-26 ENCOUNTER — Other Ambulatory Visit

## 2024-06-11 ENCOUNTER — Ambulatory Visit: Admitting: Rheumatology

## 2024-09-28 ENCOUNTER — Ambulatory Visit: Admitting: Nurse Practitioner

## 2024-09-28 ENCOUNTER — Other Ambulatory Visit
